# Patient Record
Sex: Female | Born: 1935 | Race: Black or African American | Hispanic: No | Marital: Single | Smoking: Never smoker
Health system: Southern US, Community
[De-identification: ages and names within clinical notes are randomized; demographics above are authoritative.]

## PROBLEM LIST (undated history)

## (undated) DIAGNOSIS — N179 Acute kidney failure, unspecified: Secondary | ICD-10-CM

## (undated) DIAGNOSIS — E785 Hyperlipidemia, unspecified: Secondary | ICD-10-CM

## (undated) DIAGNOSIS — I10 Essential (primary) hypertension: Secondary | ICD-10-CM

## (undated) DIAGNOSIS — F0390 Unspecified dementia without behavioral disturbance: Secondary | ICD-10-CM

---

## 2012-05-16 ENCOUNTER — Emergency Department (HOSPITAL_COMMUNITY)
Admission: EM | Admit: 2012-05-16 | Discharge: 2012-05-22 | Disposition: A | Discharge status: Other Acute Inpatient Hospital | Payer: Medicare Other | Attending: Emergency Medicine | Admitting: Emergency Medicine

## 2012-05-16 ENCOUNTER — Emergency Department (HOSPITAL_COMMUNITY): Payer: Medicare Other

## 2012-05-16 ENCOUNTER — Other Ambulatory Visit: Payer: Self-pay

## 2012-05-16 DIAGNOSIS — R4182 Altered mental status, unspecified: Secondary | ICD-10-CM | POA: Insufficient documentation

## 2012-05-16 DIAGNOSIS — Z79899 Other long term (current) drug therapy: Secondary | ICD-10-CM | POA: Insufficient documentation

## 2012-05-16 DIAGNOSIS — F259 Schizoaffective disorder, unspecified: Secondary | ICD-10-CM | POA: Insufficient documentation

## 2012-05-16 HISTORY — DX: Unspecified dementia, unspecified severity, without behavioral disturbance, psychotic disturbance, mood disturbance, and anxiety: F03.90

## 2012-05-16 HISTORY — DX: Hyperlipidemia, unspecified: E78.5

## 2012-05-16 HISTORY — DX: Essential (primary) hypertension: I10

## 2012-05-16 LAB — CBC
MCHC: 33 g/dL (ref 30.0–36.0)
Platelets: 294 10*3/uL (ref 150–400)
RDW: 12.7 % (ref 11.5–15.5)

## 2012-05-16 LAB — RAPID URINE DRUG SCREEN, HOSP PERFORMED
Amphetamines: NOT DETECTED
Opiates: NOT DETECTED
Tetrahydrocannabinol: NOT DETECTED

## 2012-05-16 LAB — COMPREHENSIVE METABOLIC PANEL
AST: 22 U/L (ref 0–37)
Albumin: 3.9 g/dL (ref 3.5–5.2)
Alkaline Phosphatase: 91 U/L (ref 39–117)
Chloride: 98 mEq/L (ref 96–112)
Creatinine, Ser: 1.18 mg/dL — ABNORMAL HIGH (ref 0.50–1.10)
Potassium: 3.3 mEq/L — ABNORMAL LOW (ref 3.5–5.1)
Sodium: 137 mEq/L (ref 135–145)
Total Bilirubin: 0.8 mg/dL (ref 0.3–1.2)

## 2012-05-16 LAB — DIFFERENTIAL
Basophils Absolute: 0 10*3/uL (ref 0.0–0.1)
Basophils Relative: 0 % (ref 0–1)
Neutro Abs: 5.3 10*3/uL (ref 1.7–7.7)
Neutrophils Relative %: 72 % (ref 43–77)

## 2012-05-16 MED ORDER — LORAZEPAM 1 MG PO TABS
1.0000 mg | ORAL_TABLET | Freq: Three times a day (TID) | ORAL | Status: DC | PRN
  Administered 2012-05-17 – 2012-05-21 (×5): 1 mg via ORAL
  Filled 2012-05-16 (×6): qty 1

## 2012-05-16 MED ORDER — AMLODIPINE BESYLATE 10 MG PO TABS
10.0000 mg | ORAL_TABLET | Freq: Every day | ORAL | Status: DC
  Administered 2012-05-16 – 2012-05-21 (×6): 10 mg via ORAL
  Filled 2012-05-16 (×6): qty 1

## 2012-05-16 MED ORDER — CITALOPRAM HYDROBROMIDE 10 MG PO TABS
10.0000 mg | ORAL_TABLET | Freq: Every day | ORAL | Status: DC
  Administered 2012-05-16 – 2012-05-21 (×6): 10 mg via ORAL
  Filled 2012-05-16 (×6): qty 1

## 2012-05-16 MED ORDER — SIMVASTATIN 20 MG PO TABS
20.0000 mg | ORAL_TABLET | Freq: Every evening | ORAL | Status: DC
  Administered 2012-05-16 – 2012-05-21 (×5): 20 mg via ORAL
  Filled 2012-05-16 (×7): qty 1

## 2012-05-16 MED ORDER — ACETAMINOPHEN 325 MG PO TABS: 650.0000 mg | ORAL_TABLET | Freq: Four times a day (QID) | ORAL | Status: DC | PRN

## 2012-05-16 NOTE — BH Assessment (Signed)
Assessment Note   Katrina Burton is an 76 y.o. female. EMS brings pt from [facility] ED notes indicate that  pt is combative at times towards staff, and having visual hallucinations seeing snakes and stating "my daughter is trying to kill Obama". During time of the assessment patient has flight of ideas focusing on the fact that her daughter and someone is trying to kill Obama. She then rambles about hound dogs out to get various people including this assessor. She then speaks of seeing "big huge snakes". She has also seen demons. Daughter at bedside explains that patient was moved from [place], [place] which is where she was residing alone up to  2 months ago. Patient moved to [facility] to live with daughter and son-in-law. Patient was diagnosed with dementia, per daughter. She was moved into a ALF Social research officer, government) after 2 months of living with daughter b/c she ran away several times. Patient moved into the ALF this past Friday. Daughter sts that over the weekend she began to speak of demons. Since Tuesdays patient has become increasingly delusional speaking of hound dogs, demons, snakes, writing a book, and telling random people that she would put them in her Living Will or give them her disability money. Patient is only oriented to person. Her concentration has decreased. Patient is not sleeping at night and will only take what daughter says as "cat naps" throughout the day. Patient denies SI and HI. No alcohol or drug use noted. Patient has no mental health history and symptoms began over the weekend.   Axis I: Psychotic Disorder NOS Axis II: Deferred Axis III: No past medical history on file. Axis IV: economic problems, educational problems, housing problems, occupational problems, other psychosocial or environmental problems, problems related to social environment, problems with access to health care services and problems with primary support group Axis V: 31-40 impairment in  reality testing  Past Medical History: No past medical history on file.  No past surgical history on file.  Family History: No family history on file.  Social History:  does not have a smoking history on file. She does not have any smokeless tobacco history on file. Her alcohol and drug histories not on file.  Additional Social History:     CIWA: CIWA-Ar BP: 127/84 mmHg Pulse Rate: 107  COWS:    Allergies: No Known Allergies  Home Medications:  (Not in a hospital admission)  OB/GYN Status:  No LMP recorded. Patient is postmenopausal.  General Assessment Data [facility] of [facility]: [facility] ACT Assessment: Yes Living Arrangements: Other (Comment) (moved from [place] to live w/ daught 2 mo's;now in ALF) Can pt return to current living arrangement?: Yes [facility] Living verified she may return once stable) Admission Status: Voluntary Is patient capable of signing voluntary admission?: No Transfer from: Acute Hospital Referral Source: Self/Family/Friend (brought by daughter (not POA); referred by ALF as well)  Education Status Is patient currently in school?: No  Risk to self Suicidal Ideation: No Suicidal Intent: No Is patient at risk for suicide?: No Suicidal Plan?: No Access to Means: No What has been your use of drugs/alcohol within the last 12 months?:  (no alcohol and/or drug use) Previous Attempts/Gestures: No How many times?:  (0) Other Self Harm Risks:  (0) Triggers for Past Attempts:  (no previous attempts or gestures) Intentional Self Injurious Behavior: None Family Suicide History: No Recent stressful life event(s):  (changes w/ living arrangements(lived w/ daughter now in ALF)) Persecutory voices/beliefs?: No Depression: No Depression Symptoms:  (  no symptoms reproted) Substance abuse history and/or treatment for substance abuse?: No Suicide prevention information given to non-admitted patients: Not applicable  Risk to Others Homicidal Ideation:  No Thoughts of Harm to Others: No Current Homicidal Intent: No Current Homicidal Plan: No Access to Homicidal Means: No Identified Victim:  (none reported) History of harm to others?: No Assessment of Violence: On admission Violent Behavior Description:  (aggressive w/ daughter, other pt's @ ALF, today-[employer]) Does patient have access to weapons?: No Criminal Charges Pending?: No Does patient have a court date: No  Psychosis Hallucinations: Visual (unable to verify aud; sts she see's snakes) Delusions: Unspecified;Grandiose (Obama will be shot, wrote a book, snakes, demons, blood houn)  Mental Status Report Appear/Hygiene: Disheveled Eye Contact: Fair Motor Activity: Agitation Speech: Aggressive;Incoherent (Aggressive per notes) Level of Consciousness: Alert Mood: Anxious;Angry;Preoccupied Affect: Anxious;Inconsistent with thought content Anxiety Level: None Thought Processes: Circumstantial;Flight of Ideas Judgement: Impaired Orientation: Person Obsessive Compulsive Thoughts/Behaviors: None  Cognitive Functioning Concentration: Normal Memory: Recent Intact;Remote Intact IQ: Average  ADLScreening [facility] Assessment Services) Patient's cognitive ability adequate to safely complete daily activities?: Yes Patient able to express need for assistance with ADLs?: Yes Independently performs ADLs?: Yes  Abuse/Neglect [facility]) Physical Abuse: Denies Verbal Abuse: Yes, past (Comment) (sts father was verbally mean to her) Sexual Abuse: Denies  Prior Inpatient Therapy Prior Inpatient Therapy: No  Prior Outpatient Therapy Prior Outpatient Therapy: No  ADL Screening (condition at time of admission) Patient's cognitive ability adequate to safely complete daily activities?: Yes Patient able to express need for assistance with ADLs?: Yes Independently performs ADLs?: Yes Weakness of Legs: None Weakness of Arms/Hands: None  Home Assistive Devices/Equipment Home Assistive  Devices/Equipment: None    Abuse/Neglect Assessment (Assessment to be complete while patient is alone) Physical Abuse: Denies Verbal Abuse: Yes, past (Comment) (sts father was verbally mean to her) Sexual Abuse: Denies Exploitation of patient/patient's resources: Denies Self-Neglect: Denies Values / Beliefs Cultural Requests During Hospitalization: None Spiritual Requests During Hospitalization: None   Advance Directives (For Healthcare) Advance Directive: Patient does not have advance directive Nutrition Screen Diet: Regular Unintentional weight loss greater than 10lbs within the last month: No Problems chewing or swallowing foods and/or liquids: No Home Tube Feeding or Total Parenteral Nutrition (TPN): No Patient appears severely malnourished: No Pregnant or Lactating: No  Additional Information 1:1 In Past 12 Months?: No CIRT Risk: No Elopement Risk: Yes (pt ran away from daughters home several x's) Does patient have medical clearance?: Yes  Child/Adolescent Assessment Running Away Risk: Admits  Disposition:  Disposition Disposition of Patient:  (Disposition pendign placement at a Geri Psych facility)  On Site Evaluation by:   Reviewed with Physician:     Octaviano Batty 05/16/2012 2:35 PM

## 2012-05-16 NOTE — ED Notes (Signed)
EMS brings pt from [facility], pt is combative at time towards staff, and having visual hallucinations seeing snakes and stating "my daughter is trying to kill Obama".

## 2012-05-16 NOTE — ED Provider Notes (Signed)
History     CSN: [id number]  Arrival date & time 05/16/12  4098   First MD Initiated Contact with Patient 05/16/12 0827      Chief Complaint  Patient presents with  . Medical Clearance  . Altered Mental Status    (Consider location/radiation/quality/duration/timing/severity/associated sxs/prior treatment) Patient is a 76 y.o. female presenting with altered mental status. The history is provided by the patient (pt states her daughter is a vampire and wants to kill obama). No language interpreter was used.  Altered Mental Status This is a new problem. The current episode started 12 to 24 hours ago. The problem occurs constantly. The problem has not changed since onset.Pertinent negatives include no chest pain, no abdominal pain and no headaches. The symptoms are aggravated by nothing. The symptoms are relieved by nothing. She has tried nothing for the symptoms. The treatment provided no relief.    No past medical history on file.  No past surgical history on file.  No family history on file.  History  Substance Use Topics  . Smoking status: Not on file  . Smokeless tobacco: Not on file  . Alcohol Use: Not on file    OB History    No data available      Review of Systems  Unable to perform ROS: Psychiatric disorder  Constitutional: Negative for fatigue.  HENT: Negative for congestion, sinus pressure and ear discharge.   Eyes: Negative for discharge.  Respiratory: Negative for cough.   Cardiovascular: Negative for chest pain.  Gastrointestinal: Negative for abdominal pain and diarrhea.  Genitourinary: Negative for frequency and hematuria.  Musculoskeletal: Negative for back pain.  Skin: Negative for rash.  Neurological: Negative for seizures and headaches.  Hematological: Negative.   Psychiatric/Behavioral: Positive for hallucinations and altered mental status.    Allergies  Review of patient's allergies indicates no known allergies.  Home Medications   Current  Outpatient Rx  Name Route Sig Dispense Refill  . ACETAMINOPHEN 325 MG PO TABS Oral Take 650 mg by mouth every 6 (six) hours as needed. pain    . AMLODIPINE BESYLATE 10 MG PO TABS Oral Take 10 mg by mouth daily.    Marland Kitchen CITALOPRAM HYDROBROMIDE 10 MG PO TABS Oral Take 10 mg by mouth daily.    Marland Kitchen SIMVASTATIN 20 MG PO TABS Oral Take 20 mg by mouth every evening.      BP 144/88  Pulse 107  Temp(Src) 98.1 F (36.7 C) (Oral)  Resp 18  SpO2 99%  Physical Exam  Constitutional: She appears well-developed.  HENT:  Head: Normocephalic and atraumatic.  Eyes: Conjunctivae and EOM are normal. No scleral icterus.  Neck: Neck supple. No thyromegaly present.  Cardiovascular: Normal rate and regular rhythm.  Exam reveals no gallop and no friction rub.   No murmur heard. Pulmonary/Chest: No stridor. She has no wheezes. She has no rales. She exhibits no tenderness.  Abdominal: She exhibits no distension. There is no tenderness. There is no rebound.  Musculoskeletal: Normal range of motion. She exhibits no edema.  Lymphadenopathy:    She has no cervical adenopathy.  Neurological: She is alert. Coordination normal.       Pt is oriented to person only  Skin: No rash noted. No erythema.  Psychiatric:       Pt having hallucinations    ED Course  Procedures (including critical care time)  Labs Reviewed  COMPREHENSIVE METABOLIC PANEL - Abnormal; Notable for the following:    Potassium 3.3 (*)    Glucose,  Bld 130 (*)    Creatinine, Ser 1.18 (*)    GFR calc non Af Amer 45 (*)    GFR calc Af Amer 52 (*)    All other components within normal limits  CBC  DIFFERENTIAL  ETHANOL  URINE RAPID DRUG SCREEN (HOSP PERFORMED)   No results found.   No diagnosis found.    MDM          Benny Lennert, MD 05/16/12 1259  Benny Lennert, MD 05/18/12 [phone]

## 2012-05-16 NOTE — Discharge Planning (Signed)
CSW called [facility] [phone]) and spoke with Letitia Caul who advised after patient has been psychiatrically stabilized, she is able to return to their facility.   Manson Passey Tenna Lacko ANN S , MSW, LCSWA 05/16/2012 2:36 PM [phone]

## 2012-05-16 NOTE — ED Notes (Signed)
ZOX:[id number]<AV> Expected date:05/16/12<BR> Expected time: 8:09 AM<BR> Means of arrival:Ambulance<BR> Comments:<BR> combative

## 2012-05-17 ENCOUNTER — Encounter (HOSPITAL_COMMUNITY): Payer: Self-pay | Admitting: Emergency Medicine

## 2012-05-17 NOTE — ED Notes (Signed)
Pt continues to pend disposition at [facility] and [facility].

## 2012-05-17 NOTE — ED Provider Notes (Signed)
Pt seen and examined.  VSS.  Nursing notes reviewed. Heart. RRR   Lungs CTA. Will continue with current plan per the ACT team.  Celene Kras, MD 05/17/12 [phone]

## 2012-05-17 NOTE — ED Notes (Signed)
Faxed EKG, XR & labs to [facility].

## 2012-05-17 NOTE — BHH Counselor (Addendum)
TC to [facility] to follow up on info sent 5/23. Got voice mail - left message. TC from April @ [facility]. Stated they had not gotten any information on this patient. Faxed over all info again.

## 2012-05-18 LAB — GLUCOSE, CAPILLARY

## 2012-05-18 MED ORDER — RISPERIDONE 0.5 MG PO TABS
0.5000 mg | ORAL_TABLET | Freq: Every day | ORAL | Status: DC
  Administered 2012-05-18 – 2012-05-20 (×3): 0.5 mg via ORAL
  Filled 2012-05-18 (×3): qty 1

## 2012-05-18 NOTE — ED Provider Notes (Addendum)
Pt alert, conversant w staff. Elevated mood. Normal vitals. Pt declines any complaints. Pending psych placement.   Suzi Roots, MD 05/18/12 [phone]  telepsych eval recommends inpt psych tx, risperdal .5 mg qhs. Act placement pending.   Suzi Roots, MD 05/18/12 [phone]

## 2012-05-18 NOTE — ED Notes (Signed)
Pt up to bathroom.

## 2012-05-18 NOTE — BHH Counselor (Signed)
[facility] called requesting additional info which was faxed and info requested filed in pt chart.

## 2012-05-18 NOTE — ED Notes (Signed)
Pt completed telepsych interview.

## 2012-05-18 NOTE — ED Notes (Signed)
Pt up to bathroom again. Pt went to sleep for about 10-15 minutes then she woke up and ate her dinner and now she's laying down watching TV.

## 2012-05-18 NOTE — ED Notes (Signed)
Pt very agitated, refusing to stay in her room, talking loudly; Security called to bedside; pt returned to room; Ativan 1 mg PO given.

## 2012-05-18 NOTE — ED Notes (Signed)
Report given to Tonya, RN.

## 2012-05-19 LAB — POCT I-STAT, CHEM 8
Calcium, Ion: 1.27 mmol/L (ref 1.12–1.32)
Chloride: 105 mEq/L (ref 96–112)
Glucose, Bld: 128 mg/dL — ABNORMAL HIGH (ref 70–99)
HCT: 35 % — ABNORMAL LOW (ref 36.0–46.0)
Hemoglobin: 11.9 g/dL — ABNORMAL LOW (ref 12.0–15.0)
TCO2: 27 mmol/L (ref 0–100)

## 2012-05-19 MED ORDER — ZIPRASIDONE MESYLATE 20 MG IM SOLR
10.0000 mg | Freq: Once | INTRAMUSCULAR | Status: AC
  Administered 2012-05-19: 20 mg via INTRAMUSCULAR
  Filled 2012-05-19: qty 20

## 2012-05-19 MED ORDER — QUETIAPINE FUMARATE 25 MG PO TABS
12.5000 mg | ORAL_TABLET | Freq: Every day | ORAL | Status: DC
  Administered 2012-05-19 – 2012-05-20 (×2): 12.5 mg via ORAL
  Filled 2012-05-19 (×2): qty 1

## 2012-05-19 NOTE — BHH Counselor (Signed)
Pt declined at [facility] by Dr. Henrene Hawking due to dementia.  [facility] only had one bed today and it gave it to another pt at [facility].  April at [facility] will run pt by MD in the AM once they have discharges.  [facility] recommends pt be IVC and for that paper work to be faxed to them.  MD will consider.

## 2012-05-19 NOTE — ED Notes (Signed)
Per Dr Rubin Payor give pt Seroquel 12.5 mg now due to change in pt's behavior, she now thinks writer is trying to kill her and she's not going to allow that to happen, she's going to use witchcraft and put a root on Clinical research associate, she told writer to get out of her room now, go on just get out. Earlier today, pt thought people were trying to kill her and the Clinical research associate and she was protecting them. Her demeanor has drastically changed from yesterday and she seems angry, hostile, definitely responding to internal and external stimuli.

## 2012-05-19 NOTE — ED Notes (Signed)
Spoke with pt's family members about not visiting with pt at this time as she was very agitated. They were agreeable. Verbalized understanding of pt's need for time to "decompress" and allow medication to work. Family members would like to speak with Dr. Wynona Canes about requesting that the facility the pt lives in moving her to a different room as her roommate agitates her. They will call in the am to attempt to contact him.

## 2012-05-19 NOTE — ED Notes (Signed)
Pt is up in the shower.

## 2012-05-19 NOTE — BH Assessment (Signed)
Assessment Note   From original 5/23 assessment: Katrina Burton is an 76 y.o. female. EMS brings pt from [facility] ED notes indicate that  pt is combative at times towards staff, and having visual hallucinations seeing snakes and stating "my daughter is trying to kill Obama". During time of the assessment patient has flight of ideas focusing on the fact that her daughter and someone is trying to kill Obama. She then rambles about hound dogs out to get various people including this assessor. She then speaks of seeing "big huge snakes". She has also seen demons. Patient was diagnosed with dementia, per daughter. She was moved into a ALF Social research officer, government) after 2 months of living with daughter b/c she ran away several times. Patient moved into the ALF this past Friday. Since Tuesdays patient has become increasingly delusional speaking of hound dogs, demons, snakes, writing a book, and telling random people that she would put them in her Living Will or give them her disability money. Patient is only oriented to person. Patient has no mental health history and symptoms began over the weekend.    Writer reassessment 05/19/12 - Pt is delusional and having hallucinations. Pt denies A/VH. However, pt tells Clinical research associate that her friend Katrina Burton works at [employer] and has visited her several times. (No one with that name works here). Pt sitting on bed, good eye contact. Pt is pleasant and cooperative. Pt states that she lives with a woman named Katrina Burton who gives pt new shoes. Pt denies SI and HI. Prior to reassessment, Clinical research associate could hear pt screaming for several minutes from her room. At that time pt was very agitated and aggressive.  Per RN Tonya's note from 1900 today: Per Dr Rubin Payor give pt Seroquel 12.5 mg now due to change in pt's behavior, she now thinks writer is trying to kill her and she's not going to allow that to happen, she's going to use witchcraft and put a root on Clinical research associate, she told writer to  get out of her room now, go on just get out. Earlier today, pt thought people were trying to kill her and the Clinical research associate and she was protecting them. Her demeanor has drastically changed from yesterday and she seems angry, hostile, definitely responding to internal and external stimuli.     Axis I: Psychotic Disorder NOS Axis II: Deferred Axis III:  Past Medical History  Diagnosis Date  . Dementia   . Hypertension   . Hyperlipidemia   . Diabetes mellitus    Axis IV: housing problems, other psychosocial or environmental problems, problems related to social environment and problems with primary support group Axis V: 21-30 behavior considerably influenced by delusions or hallucinations OR serious impairment in judgment, communication OR inability to function in almost all areas  Past Medical History:  Past Medical History  Diagnosis Date  . Dementia   . Hypertension   . Hyperlipidemia   . Diabetes mellitus     History reviewed. No pertinent past surgical history.  Family History: History reviewed. No pertinent family history.  Social History:  reports that she has never smoked. She does not have any smokeless tobacco history on file. She reports that she does not drink alcohol or use illicit drugs.  Additional Social History:     CIWA: CIWA-Ar BP: 163/82 mmHg Pulse Rate: 95  COWS:    Allergies: No Known Allergies  Home Medications:  (Not in a hospital admission)  OB/GYN Status:  No LMP recorded. Patient is postmenopausal.  General Assessment  Data [facility] of [facility]: [facility] ACT Assessment: Yes Living Arrangements: Other (Comment) (moved from [place] to live w/ daught 2 mo's;now in ALF) Can pt return to current living arrangement?: Yes [facility] Living verified she may return once stable) Admission Status: Voluntary Is patient capable of signing voluntary admission?: No Transfer from: Acute Hospital Referral Source: Self/Family/Friend (brought by daughter (not POA);  referred by ALF as well)  Education Status Is patient currently in school?: No  Risk to self Suicidal Ideation: No Suicidal Intent: No Is patient at risk for suicide?: No Suicidal Plan?: No Access to Means: No What has been your use of drugs/alcohol within the last 12 months?:  (no alcohol and/or drug use) Previous Attempts/Gestures: No How many times?:  (0) Other Self Harm Risks:  (0) Triggers for Past Attempts:  (no previous attempts or gestures) Intentional Self Injurious Behavior: None Family Suicide History: No Recent stressful life event(s):  (changes w/ living arrangements(lived w/ daughter now in ALF)) Persecutory voices/beliefs?: No Depression: No Depression Symptoms:  (no symptoms reproted) Substance abuse history and/or treatment for substance abuse?: No Suicide prevention information given to non-admitted patients: Not applicable  Risk to Others Homicidal Ideation: No Thoughts of Harm to Others: No Current Homicidal Intent: No Current Homicidal Plan: No Access to Homicidal Means: No Identified Victim:  (none reported) History of harm to others?: No Assessment of Violence: On admission Violent Behavior Description:  (aggressive w/ daughter, other pt's @ ALF, today-[employer]) Does patient have access to weapons?: No Criminal Charges Pending?: No Does patient have a court date: No  Psychosis Hallucinations: Visual;Auditory Delusions:  (lives with woman named Information systems manager)  Mental Status Report Appear/Hygiene: Disheveled Eye Contact: Good Motor Activity: Freedom of movement Speech: Incoherent Level of Consciousness: Alert Mood: Anxious;Preoccupied Affect: Anxious;Inconsistent with thought content Anxiety Level: None Thought Processes: Flight of Ideas;Tangential;Circumstantial Judgement: Impaired Orientation: Person Obsessive Compulsive Thoughts/Behaviors: None  Cognitive Functioning Concentration: Normal Memory: Recent Intact;Remote Intact IQ:  Average  ADLScreening [facility] Assessment Services) Patient's cognitive ability adequate to safely complete daily activities?: Yes Patient able to express need for assistance with ADLs?: Yes Independently performs ADLs?: Yes  Abuse/Neglect [facility]) Physical Abuse: Denies Verbal Abuse: Yes, past (Comment) (sts father was verbally mean to her) Sexual Abuse: Denies  Prior Inpatient Therapy Prior Inpatient Therapy: No  Prior Outpatient Therapy Prior Outpatient Therapy: No  ADL Screening (condition at time of admission) Patient's cognitive ability adequate to safely complete daily activities?: Yes Patient able to express need for assistance with ADLs?: Yes Independently performs ADLs?: Yes Communication: Independent Is this a change from baseline?: Pre-admission baseline Dressing (OT): Needs assistance Is this a change from baseline?: Pre-admission baseline Grooming: Needs assistance Is this a change from baseline?: Pre-admission baseline Feeding: Independent Bathing: Needs assistance Is this a change from baseline?: Pre-admission baseline Toileting: Needs assistance Is this a change from baseline?: Pre-admission baseline In/Out Bed: Needs assistance Is this a change from baseline?: Pre-admission baseline Walks in Home: Needs assistance Is this a change from baseline?: Pre-admission baseline Weakness of Legs: None Weakness of Arms/Hands: None  Home Assistive Devices/Equipment Home Assistive Devices/Equipment: None    Abuse/Neglect Assessment (Assessment to be complete while patient is alone) Physical Abuse: Denies Verbal Abuse: Yes, past (Comment) (sts father was verbally mean to her) Sexual Abuse: Denies Exploitation of patient/patient's resources: Denies Self-Neglect: Denies Values / Beliefs Cultural Requests During Hospitalization: None Spiritual Requests During Hospitalization: None   Advance Directives (For Healthcare) Advance Directive: Patient does not have advance  directive Nutrition Screen Diet: Regular  Unintentional weight loss greater than 10lbs within the last month: No Problems chewing or swallowing foods and/or liquids: No Home Tube Feeding or Total Parenteral Nutrition (TPN): No Patient appears severely malnourished: No Pregnant or Lactating: No  Additional Information 1:1 In Past 12 Months?: No CIRT Risk: No Elopement Risk: Yes (pt ran away from daughters home several x's) Does patient have medical clearance?: Yes  Child/Adolescent Assessment Running Away Risk: Admits  Disposition:  Disposition Disposition of Patient:  (Disposition pendign placement at a Geri Psych facility)  On Site Evaluation by:   Reviewed with Physician:     Donnamarie Rossetti P 05/19/2012 10:44 PM

## 2012-05-19 NOTE — ED Provider Notes (Signed)
  Physical Exam  BP 163/82  Pulse 95  Temp(Src) 97.6 F (36.4 C) (Oral)  Resp 20  SpO2 100%  Physical Exam  ED Course  Procedures  MDM Patient became disruptive and began yelling. She'll be given some more sedation.      Juliet Rude. Rubin Payor, MD 05/19/12 1941

## 2012-05-19 NOTE — ED Notes (Signed)
Pt's labs were drawn.

## 2012-05-19 NOTE — ED Notes (Signed)
Pt has been yelling and fussing talking to herself about people that are trying to kill [place] but her and "Bud" are protecting [place]. Pt denies AVH but clearly she's suffering from both more than likely. Pt's demeanor is very different than it was yesterday. She seems angry but denies being angry.

## 2012-05-19 NOTE — ED Provider Notes (Signed)
77 y.o. Female from [facility] with reports visual hallucinations and combative.  [place] to review today at 11 a.m.  Temp:  [94 F (34.4 C)-98.1 F (36.7 C)] 97.6 F (36.4 C) (05/26 0612) Pulse Rate:  [70-84] 84  (05/26 0612) Resp:  [20] 20  (05/26 0612) BP: (115-156)/(61-82) 152/82 mmHg (05/26 0612) SpO2:  [99 %-100 %] 99 % (05/26 0612)   Hilario Quarry, MD 05/19/12 [phone]

## 2012-05-19 NOTE — BHH Counselor (Signed)
Albesa Seen, assessment counselor at [facility], submitted Pt for admission to [facility]. Consulted with Theodoro Kos, [facility] who confirmed bed availability. Dr. Elsie Saas reviewed Pt's clinical information and declined due to Pt's medical acuity and diagnosis of dementia. He recommended referral to a gero-psych unit. Communicated information to [employer].  Shela Commons, [facility]

## 2012-05-20 LAB — CBC
HCT: 37.9 % (ref 36.0–46.0)
MCHC: 33.5 g/dL (ref 30.0–36.0)
RDW: 12.8 % (ref 11.5–15.5)

## 2012-05-20 NOTE — ED Notes (Signed)
CSW faxed pt's IVC 1st opinion, CBC and labs to [facility] for them to review. CSW will continue to follow up regarding disposition.

## 2012-05-20 NOTE — ED Notes (Signed)
Pt is in her room talking aloud, confused, but not combative. Sounds as if she is discussing events from the past. Crayons and coloring sheets given. Pt is watching television.

## 2012-05-20 NOTE — ED Notes (Signed)
Pt is confused, restless at times. Talking to herself in her room. Coloring multiple pictures for staff. Calm and cooperative but very disoriented.

## 2012-05-20 NOTE — BHH Counselor (Signed)
IVC paperwork faxed to [place]. 

## 2012-05-20 NOTE — ED Notes (Signed)
Per staff at [facility], pt has been accepted however her bed will not be ready until 05/21/12. Oncoming staff will need to call back the morning of 05/21/12 to verify accepting MD and bed availability.

## 2012-05-20 NOTE — ED Notes (Signed)
Pt from Psych ED to room 28. Pt oriented only to self but pleasant and cooperative. Sitting up in chair, coloring in coloring book w/ crayons. No c/o at present. Report from [place], [place].

## 2012-05-20 NOTE — ED Provider Notes (Signed)
The patient is alert, and currently, calm. She remains confused and delusional. Patient cannot be discharged to home and a placement is being sought. She reportedly has been accepted at [facility], but there are no beds yet.  Flint Melter, MD 05/20/12 [phone]

## 2012-05-20 NOTE — BHH Counselor (Signed)
Magistrate Katrina Burton received IVC paperwork and 1st opinion. Originals filed in IVC log and copies placed in pt's chart.

## 2012-05-20 NOTE — ED Notes (Addendum)
Patient used her room as a BR voiding in the floor and having a BM on the floor, heard people talking and laughing  At her and she decided she could not go to the BR, taking her clothes off, taking a shower and changing her scrubs, will be transferring to Rm 28 in TCU.

## 2012-05-20 NOTE — ED Notes (Signed)
Pt spoke w/ her daughter, Talbert Forest, on the phone. Tells her daughter that she is "not crazy" and "not a lunatic". States that the bloodhounds are after her and that everyone needs to beware. Presently sitting in her room coloring and reading magazines quietly.

## 2012-05-21 NOTE — ED Notes (Signed)
Patient is resting comfortably. 

## 2012-05-21 NOTE — ED Notes (Signed)
Pt is moderately anxious.  Pt is having loose thoughts, but is cooperative at this time.

## 2012-05-21 NOTE — ED Notes (Signed)
Pt is currently coloring and behavior is calm.

## 2012-05-21 NOTE — Discharge Planning (Signed)
CSW left message at [facility] center regarding patient's bed availability.  Katrina Burton ANN S , MSW, LCSWA 05/21/2012 7:47 AM [phone]

## 2012-05-21 NOTE — ED Notes (Signed)
Pt has been accepted to [facility] for inpatient psychiatric treatment. EDP has been notified and is in agreement with disposition. RN made aware to call report. Pt is under IVC and will require sheriff transport. No further needs identified at this time.

## 2012-05-21 NOTE — BHH Counselor (Signed)
Pt accepted at [facility].  Will need to be transported after 5 pm this evening.

## 2012-05-21 NOTE — ED Provider Notes (Signed)
BP 147/67  Pulse 84  Temp(Src) 98.3 F (36.8 C) (Oral)  Resp 16  SpO2 99% Pt resting comfortably. No issue overnight. Accepted at [facility]. Continues to await bed.   Loren Racer, MD 05/21/12 [phone]

## 2012-05-21 NOTE — ED Notes (Signed)
Attempted to call report to nurse at [facility].  Was told by nurse to call report when patient is leaving this facility.  Will give report/ message to oncoming nurse.

## 2013-01-18 ENCOUNTER — Encounter (HOSPITAL_COMMUNITY): Payer: Self-pay | Admitting: *Deleted

## 2013-01-18 ENCOUNTER — Emergency Department (HOSPITAL_COMMUNITY): Payer: Medicare Other

## 2013-01-18 ENCOUNTER — Emergency Department (HOSPITAL_COMMUNITY)
Admission: EM | Admit: 2013-01-18 | Discharge: 2013-01-19 | Disposition: A | Discharge status: Home / Self Care | Payer: Medicare Other | Attending: Emergency Medicine | Admitting: Emergency Medicine

## 2013-01-18 DIAGNOSIS — R296 Repeated falls: Secondary | ICD-10-CM | POA: Insufficient documentation

## 2013-01-18 DIAGNOSIS — F0390 Unspecified dementia without behavioral disturbance: Secondary | ICD-10-CM | POA: Insufficient documentation

## 2013-01-18 DIAGNOSIS — S0990XA Unspecified injury of head, initial encounter: Secondary | ICD-10-CM | POA: Insufficient documentation

## 2013-01-18 DIAGNOSIS — IMO0002 Reserved for concepts with insufficient information to code with codable children: Secondary | ICD-10-CM | POA: Insufficient documentation

## 2013-01-18 DIAGNOSIS — Y9389 Activity, other specified: Secondary | ICD-10-CM | POA: Insufficient documentation

## 2013-01-18 DIAGNOSIS — Y9210 Unspecified residential institution as the place of occurrence of the external cause: Secondary | ICD-10-CM | POA: Insufficient documentation

## 2013-01-18 DIAGNOSIS — I10 Essential (primary) hypertension: Secondary | ICD-10-CM | POA: Insufficient documentation

## 2013-01-18 DIAGNOSIS — W19XXXA Unspecified fall, initial encounter: Secondary | ICD-10-CM

## 2013-01-18 DIAGNOSIS — S79919A Unspecified injury of unspecified hip, initial encounter: Secondary | ICD-10-CM | POA: Insufficient documentation

## 2013-01-18 DIAGNOSIS — E119 Type 2 diabetes mellitus without complications: Secondary | ICD-10-CM | POA: Insufficient documentation

## 2013-01-18 DIAGNOSIS — E785 Hyperlipidemia, unspecified: Secondary | ICD-10-CM | POA: Insufficient documentation

## 2013-01-18 DIAGNOSIS — S8990XA Unspecified injury of unspecified lower leg, initial encounter: Secondary | ICD-10-CM | POA: Insufficient documentation

## 2013-01-18 DIAGNOSIS — Z79899 Other long term (current) drug therapy: Secondary | ICD-10-CM | POA: Insufficient documentation

## 2013-01-18 DIAGNOSIS — S79929A Unspecified injury of unspecified thigh, initial encounter: Secondary | ICD-10-CM | POA: Insufficient documentation

## 2013-01-18 LAB — CBC WITH DIFFERENTIAL/PLATELET
Basophils Absolute: 0 10*3/uL (ref 0.0–0.1)
Eosinophils Absolute: 0.1 10*3/uL (ref 0.0–0.7)
Eosinophils Relative: 2 % (ref 0–5)
Lymphocytes Relative: 29 % (ref 12–46)
MCV: 90.4 fL (ref 78.0–100.0)
Platelets: 95 10*3/uL — ABNORMAL LOW (ref 150–400)
RDW: 12.7 % (ref 11.5–15.5)
WBC: 6.6 10*3/uL (ref 4.0–10.5)

## 2013-01-18 LAB — URINALYSIS, ROUTINE W REFLEX MICROSCOPIC
Bilirubin Urine: NEGATIVE
Ketones, ur: NEGATIVE mg/dL
Nitrite: NEGATIVE
Specific Gravity, Urine: 1.006 (ref 1.005–1.030)
Urobilinogen, UA: 0.2 mg/dL (ref 0.0–1.0)

## 2013-01-18 LAB — BASIC METABOLIC PANEL
CO2: 25 mEq/L (ref 19–32)
Calcium: 10.3 mg/dL (ref 8.4–10.5)
GFR calc Af Amer: 58 mL/min — ABNORMAL LOW (ref >90)
GFR calc non Af Amer: 50 mL/min — ABNORMAL LOW (ref >90)
Sodium: 138 mEq/L (ref 135–145)

## 2013-01-18 NOTE — ED Provider Notes (Signed)
History     CSN: [id number]  Arrival date & time 01/18/13  2151   First MD Initiated Contact with Patient 01/18/13 2206      Chief Complaint  Patient presents with  . Fall    (Consider location/radiation/quality/duration/timing/severity/associated sxs/prior treatment) HPI Comments: This is a 77 year old female, past medical history her markable for dementia, hypertension, hyperlipidemia, and diabetes, who presents emergency department with chief complaint of fall. Patient is a resident at [facility]. Patient is a very poor historian, but states that she fell while in the bathroom tonight. She denies any loss of consciousness. She states that she hit her head, and that her left leg and left hip are hurting her. She states the pain is 7/10. Nothing makes her symptoms better or worse. She has not tried anything to alleviate her symptoms. Level 5 caveat applies.  The history is provided by the patient. No language interpreter was used.    Past Medical History  Diagnosis Date  . Dementia   . Hypertension   . Hyperlipidemia   . Diabetes mellitus     History reviewed. No pertinent past surgical history.  History reviewed. No pertinent family history.  History  Substance Use Topics  . Smoking status: Never Smoker   . Smokeless tobacco: Not on file  . Alcohol Use: No    OB History    Grav Para Term Preterm Abortions TAB SAB Ect Mult Living                  Review of Systems  Unable to perform ROS: Dementia    Allergies  Lasix; Lipitor; and Soap  Home Medications   Current Outpatient Rx  Name  Route  Sig  Dispense  Refill  . ACETAMINOPHEN 325 MG PO TABS   Oral   Take 650 mg by mouth every 4 (four) hours as needed. For headache or fever         . ALBUTEROL SULFATE (2.5 MG/3ML) 0.083% IN NEBU   Nebulization   Take 2.5 mg by nebulization every 4 (four) hours as needed. For wheezing         . AMLODIPINE BESYLATE 10 MG PO TABS   Oral   Take 10 mg  by mouth daily.         Marland Kitchen CITALOPRAM HYDROBROMIDE 10 MG PO TABS   Oral   Take 10 mg by mouth daily.         Marland Kitchen DIVALPROEX SODIUM ER 250 MG PO TB24   Oral   Take 250 mg by mouth at bedtime.         . GUAIFENESIN-CODEINE 100-10 MG/5ML PO SYRP   Oral   Take 10 mLs by mouth every 6 (six) hours as needed. For cough         . HYDROCHLOROTHIAZIDE 25 MG PO TABS   Oral   Take 25 mg by mouth daily.         Marland Kitchen LOPERAMIDE HCL 2 MG PO CAPS   Oral   Take 2 mg by mouth daily as needed. For diarrhea         . LORAZEPAM 0.5 MG PO TABS   Oral   Take 0.5 mg by mouth 2 (two) times daily.         Marland Kitchen OLANZAPINE 15 MG PO TABS   Oral   Take 15 mg by mouth at bedtime.         Marland Kitchen POTASSIUM CHLORIDE ER 10 MEQ PO TBCR  Oral   Take 10 mEq by mouth daily.         Marland Kitchen RIVASTIGMINE 9.5 MG/24HR TD PT24   Transdermal   Place 1 patch onto the skin daily.         Marland Kitchen SIMVASTATIN 20 MG PO TABS   Oral   Take 20 mg by mouth every evening.         Marland Kitchen SPIRONOLACTONE 25 MG PO TABS   Oral   Take 25 mg by mouth daily.         . TRAMADOL HCL 50 MG PO TABS   Oral   Take 50 mg by mouth 2 (two) times daily.         . TRAZODONE HCL 150 MG PO TABS   Oral   Take 150 mg by mouth at bedtime.           BP 166/76  Pulse 109  Temp 99.1 F (37.3 C) (Oral)  Resp 14  SpO2 93%  Physical Exam  Nursing note and vitals reviewed. Constitutional: She is oriented to person, place, and time. She appears well-developed and well-nourished.  HENT:  Head: Normocephalic and atraumatic.  Eyes: Conjunctivae normal and EOM are normal.  Neck: Normal range of motion. Neck supple.  Cardiovascular: Normal rate and regular rhythm.  Exam reveals no gallop and no friction rub.   No murmur heard.      Mildly tachycardic  Pulmonary/Chest: Effort normal and breath sounds normal. No respiratory distress. She has no wheezes. She has no rales. She exhibits no tenderness.  Abdominal: Soft. Bowel sounds are  normal. She exhibits no distension and no mass. There is no tenderness. There is no rebound and no guarding.  Musculoskeletal: Normal range of motion. She exhibits no edema and no tenderness.       No signs of trauma or gross abnormality, left hip painful to palpation over left greater trochanter.   Neurological: She is alert and oriented to person, place, and time.       CN 3-12 intact  Skin: Skin is warm and dry.  Psychiatric: She has a normal mood and affect. Her behavior is normal. Judgment and thought content normal.    ED Course  Procedures (including critical care time)   Labs Reviewed  CBC WITH DIFFERENTIAL  BASIC METABOLIC PANEL  URINALYSIS, ROUTINE W REFLEX MICROSCOPIC    ED ECG REPORT  I personally interpreted this EKG   Date: 01/18/2013   Rate: 92  Rhythm: normal sinus rhythm  QRS Axis: normal  Intervals: normal  ST/T Wave abnormalities: nonspecific T wave changes  Conduction Disutrbances:none  Narrative Interpretation:   Old EKG Reviewed: unchanged  Results for orders placed during the hospital encounter of 01/18/13  CBC WITH DIFFERENTIAL      Component Value Range   WBC 6.6  4.0 - 10.5 K/uL   RBC 3.95  3.87 - 5.11 MIL/uL   Hemoglobin 12.4  12.0 - 15.0 g/dL   HCT 91.4 (*) 78.2 - 95.6 %   MCV 90.4  78.0 - 100.0 fL   MCH 31.4  26.0 - 34.0 pg   MCHC 34.7  30.0 - 36.0 g/dL   RDW 21.3  08.6 - 57.8 %   Platelets 95 (*) 150 - 400 K/uL   Neutrophils Relative 61  43 - 77 %   Neutro Abs 4.0  1.7 - 7.7 K/uL   Lymphocytes Relative 29  12 - 46 %   Lymphs Abs 1.9  0.7 - 4.0 K/uL  Monocytes Relative 8  3 - 12 %   Monocytes Absolute 0.5  0.1 - 1.0 K/uL   Eosinophils Relative 2  0 - 5 %   Eosinophils Absolute 0.1  0.0 - 0.7 K/uL   Basophils Relative 0  0 - 1 %   Basophils Absolute 0.0  0.0 - 0.1 K/uL  BASIC METABOLIC PANEL      Component Value Range   Sodium 138  135 - 145 mEq/L   Potassium 4.0  3.5 - 5.1 mEq/L   Chloride 99  96 - 112 mEq/L   CO2 25  19 - 32  mEq/L   Glucose, Bld 150 (*) 70 - 99 mg/dL   BUN 22  6 - 23 mg/dL   Creatinine, Ser 1.61  0.50 - 1.10 mg/dL   Calcium 09.6  8.4 - 04.5 mg/dL   GFR calc non Af Amer 50 (*) >90 mL/min   GFR calc Af Amer 58 (*) >90 mL/min  URINALYSIS, ROUTINE W REFLEX MICROSCOPIC      Component Value Range   Color, Urine YELLOW  YELLOW   APPearance CLEAR  CLEAR   Specific Gravity, Urine 1.006  1.005 - 1.030   pH 7.0  5.0 - 8.0   Glucose, UA NEGATIVE  NEGATIVE mg/dL   Hgb urine dipstick NEGATIVE  NEGATIVE   Bilirubin Urine NEGATIVE  NEGATIVE   Ketones, ur NEGATIVE  NEGATIVE mg/dL   Protein, ur NEGATIVE  NEGATIVE mg/dL   Urobilinogen, UA 0.2  0.0 - 1.0 mg/dL   Nitrite NEGATIVE  NEGATIVE   Leukocytes, UA TRACE (*) NEGATIVE  URINE MICROSCOPIC-ADD ON      Component Value Range   Squamous Epithelial / LPF RARE  RARE   WBC, UA 0-2  <3 WBC/hpf   RBC / HPF 0-2  <3 RBC/hpf   Bacteria, UA RARE  RARE   Dg Chest 2 View  01/19/2013  *RADIOLOGY REPORT*  Clinical Data: Chest pain.  Hypertensive diabetic.  CHEST - 2 VIEW  Comparison: 05/16/2012.  Findings: Cardiomegaly.  Pulmonary vascular prominence most notable centrally.  Prominent mediastinum with  tortuous aorta. Limiting for evaluating for underlying mediastinal mass or adenopathy.  No segmental consolidation or pneumothorax.  Tiny pleural effusions not excluded.  IMPRESSION: Cardiomegaly.  Pulmonary vascular prominence most notable centrally.  Prominent mediastinum with  tortuous aorta. Limiting for evaluating for underlying mediastinal mass or adenopathy.  No segmental consolidation or pneumothorax.  Tiny pleural effusions not excluded.   Original Report Authenticated By: Lacy Duverney, M.D.    Dg Hip Complete Left  01/19/2013  *RADIOLOGY REPORT*  Clinical Data: Status post fall; left hip pain.  LEFT HIP - COMPLETE 2+ VIEW  Comparison: None.  Findings: There is no evidence of fracture or dislocation.  Both femoral heads are seated normally within their respective  acetabula.  The proximal left femur appears intact.  No significant degenerative change is appreciated.  The sacroiliac joints are unremarkable in appearance.  The visualized bowel gas pattern is grossly unremarkable in appearance.  IMPRESSION: No evidence of fracture or dislocation.   Original Report Authenticated By: Tonia Ghent, M.D.    Dg Femur Left  01/19/2013  *RADIOLOGY REPORT*  Clinical Data: Status post fall; left proximal leg pain.  LEFT FEMUR - 2 VIEW  Comparison: Left hip radiographs performed earlier today at 11:10 p.m.  Findings: The left femur appears intact.  There is no evidence of fracture or dislocation.  The left femoral head remains seated at the acetabulum.  An enthesophyte  is noted at the superior pole of the patella.  No significant knee joint effusion is identified; the knee joint is grossly unremarkable in appearance.  The visualized bowel gas pattern is grossly unremarkable.  No significant soft tissue abnormalities are characterized on radiograph.  IMPRESSION: No evidence of fracture or dislocation.   Original Report Authenticated By: Tonia Ghent, M.D.    Ct Head Wo Contrast  01/18/2013  *RADIOLOGY REPORT*  Clinical Data: Status post fall; bilateral headache and pain over the left orbit.  CT HEAD WITHOUT CONTRAST  Technique:  Contiguous axial images were obtained from the base of the skull through the vertex without contrast.  Comparison: CT of the head performed 05/06/2012  Findings: There is no evidence of acute infarction, mass lesion, or intra- or extra-axial hemorrhage on CT.  Scattered periventricular white matter change likely reflects small vessel ischemic microangiopathy.  The posterior fossa, including the cerebellum, brainstem and fourth ventricle, is within normal limits.  The third and lateral ventricles, and basal ganglia are unremarkable in appearance.  The cerebral hemispheres are symmetric in appearance, with normal gray- white differentiation.  No mass effect  or midline shift is seen.  There is no evidence of fracture; visualized osseous structures are unremarkable in appearance.  The visualized portions of the orbits are within normal limits.  The paranasal sinuses and mastoid air cells are well-aerated.  No significant soft tissue abnormalities are seen.  IMPRESSION:  1.  No evidence of traumatic intracranial injury or fracture. 2.  Mild small vessel ischemic microangiopathy.   Original Report Authenticated By: Tonia Ghent, M.D.       1. Fall       MDM  This is a 77 year old female, past medical history remarkable for dementia, who presents with a fall. I believe this fall to be a mechanical fall. As the patient is slightly alternative baseline secondary to the dementia, I'm going to get a CT scan of her brain. Will order routine labs. Patient also complains of some left sided hip pain, therefore will order plain films. I have discussed the patient with Dr. Read Drivers, who will see the patient.   12:37 AM This patient was seen by and discussed with Dr. Read Drivers, who agrees with the plan.  No acute injury found from this mechanical fall. I am going to discharge the patient back to her care center.  She is stable and ready for discharge.        Roxy Horseman, PA-C 01/19/13 0040

## 2013-01-18 NOTE — ED Notes (Signed)
Per EMS: pt from [facility].  States she was in the bathroom when she fell, denies LOC.  C/o left leg, right hip, and left flank pain.  States she has had chronic pain in these areas but worse today.  Pt hypertensive at 200/100.  Hx of htn, ST at 120.

## 2013-01-19 NOTE — ED Provider Notes (Signed)
Medical screening examination/treatment/procedure(s) were conducted as a shared visit with non-physician practitioner(s) and myself.  I personally evaluated the patient during the encounter  Patient awake and alert; inappropriate answers to questions consistent with patient's documented dementia. No apparent pain on passive range of motion of hips bilaterally.  Hanley Seamen, MD 01/19/13 [phone]

## 2013-01-19 NOTE — ED Notes (Signed)
I gave the patient a warm blanket. 

## 2013-01-19 NOTE — ED Provider Notes (Signed)
Medical screening examination/treatment/procedure(s) were performed by non-physician practitioner and as supervising physician I was immediately available for consultation/collaboration.  Derwood Kaplan, MD 01/19/13 1605

## 2013-01-19 NOTE — ED Notes (Signed)
I gave the patient 2 containers of cranberry juice and a pack of graham crackers.

## 2013-01-19 NOTE — ED Notes (Signed)
I gave the patient a second pack of graham crackers.

## 2013-01-27 ENCOUNTER — Encounter (HOSPITAL_COMMUNITY): Payer: Self-pay | Admitting: *Deleted

## 2013-01-27 ENCOUNTER — Emergency Department (HOSPITAL_COMMUNITY): Payer: Medicare Other

## 2013-01-27 ENCOUNTER — Emergency Department (HOSPITAL_COMMUNITY)
Admission: EM | Admit: 2013-01-27 | Discharge: 2013-01-28 | Disposition: A | Discharge status: Home / Self Care | Payer: Medicare Other | Attending: Emergency Medicine | Admitting: Emergency Medicine

## 2013-01-27 DIAGNOSIS — R531 Weakness: Secondary | ICD-10-CM

## 2013-01-27 DIAGNOSIS — R1084 Generalized abdominal pain: Secondary | ICD-10-CM | POA: Insufficient documentation

## 2013-01-27 DIAGNOSIS — E119 Type 2 diabetes mellitus without complications: Secondary | ICD-10-CM | POA: Insufficient documentation

## 2013-01-27 DIAGNOSIS — R079 Chest pain, unspecified: Secondary | ICD-10-CM | POA: Insufficient documentation

## 2013-01-27 DIAGNOSIS — R112 Nausea with vomiting, unspecified: Secondary | ICD-10-CM | POA: Insufficient documentation

## 2013-01-27 DIAGNOSIS — E785 Hyperlipidemia, unspecified: Secondary | ICD-10-CM | POA: Insufficient documentation

## 2013-01-27 DIAGNOSIS — R0602 Shortness of breath: Secondary | ICD-10-CM | POA: Insufficient documentation

## 2013-01-27 DIAGNOSIS — R51 Headache: Secondary | ICD-10-CM | POA: Insufficient documentation

## 2013-01-27 DIAGNOSIS — I10 Essential (primary) hypertension: Secondary | ICD-10-CM | POA: Insufficient documentation

## 2013-01-27 DIAGNOSIS — R059 Cough, unspecified: Secondary | ICD-10-CM | POA: Insufficient documentation

## 2013-01-27 DIAGNOSIS — R5383 Other fatigue: Secondary | ICD-10-CM | POA: Insufficient documentation

## 2013-01-27 DIAGNOSIS — R05 Cough: Secondary | ICD-10-CM

## 2013-01-27 DIAGNOSIS — Z79899 Other long term (current) drug therapy: Secondary | ICD-10-CM | POA: Insufficient documentation

## 2013-01-27 DIAGNOSIS — F0390 Unspecified dementia without behavioral disturbance: Secondary | ICD-10-CM | POA: Insufficient documentation

## 2013-01-27 DIAGNOSIS — R5381 Other malaise: Secondary | ICD-10-CM | POA: Insufficient documentation

## 2013-01-27 LAB — COMPREHENSIVE METABOLIC PANEL
ALT: 14 U/L (ref 0–35)
CO2: 24 mEq/L (ref 19–32)
Calcium: 10.6 mg/dL — ABNORMAL HIGH (ref 8.4–10.5)
GFR calc Af Amer: 62 mL/min — ABNORMAL LOW (ref >90)
GFR calc non Af Amer: 53 mL/min — ABNORMAL LOW (ref >90)
Glucose, Bld: 142 mg/dL — ABNORMAL HIGH (ref 70–99)
Sodium: 133 mEq/L — ABNORMAL LOW (ref 135–145)

## 2013-01-27 LAB — CBC WITH DIFFERENTIAL/PLATELET
Eosinophils Absolute: 0.1 10*3/uL (ref 0.0–0.7)
Eosinophils Relative: 1 % (ref 0–5)
Hemoglobin: 13 g/dL (ref 12.0–15.0)
Lymphs Abs: 2.4 10*3/uL (ref 0.7–4.0)
MCH: 31.1 pg (ref 26.0–34.0)
MCV: 91.1 fL (ref 78.0–100.0)
Monocytes Relative: 8 % (ref 3–12)
RBC: 4.18 MIL/uL (ref 3.87–5.11)

## 2013-01-27 LAB — URINALYSIS, ROUTINE W REFLEX MICROSCOPIC
Glucose, UA: NEGATIVE mg/dL
Hgb urine dipstick: NEGATIVE
Ketones, ur: NEGATIVE mg/dL
Protein, ur: NEGATIVE mg/dL

## 2013-01-27 LAB — VALPROIC ACID LEVEL: Valproic Acid Lvl: 16 ug/mL — ABNORMAL LOW (ref 50.0–100.0)

## 2013-01-27 MED ORDER — SODIUM CHLORIDE 0.9 % IV SOLN
INTRAVENOUS | Status: DC
  Administered 2013-01-27: 20:00:00 via INTRAVENOUS

## 2013-01-27 MED ORDER — IOHEXOL 350 MG/ML SOLN
100.0000 mL | Freq: Once | INTRAVENOUS | Status: AC | PRN
  Administered 2013-01-27: 100 mL via INTRAVENOUS

## 2013-01-27 NOTE — ED Notes (Signed)
Per EMS pt is from [facility]. Pt states that for about the last two weeks she has been experiencing flu like symptoms with a hacking cough and nausea and vomiting that has increasingly gotten worse since yesterday. Per EMS BP190/80, HR 100. Pt is alert and oriented, ambulatory.

## 2013-01-27 NOTE — ED Provider Notes (Signed)
History     CSN: [id number]  Arrival date & time 01/27/13  1751   First MD Initiated Contact with Patient 01/27/13 1844      Chief Complaint  Patient presents with  . Influenza    flu like symptoms    (Consider location/radiation/quality/duration/timing/severity/associated sxs/prior treatment) Patient is a 77 y.o. female presenting with flu symptoms. The history is provided by the patient.  Influenza Associated symptoms include chest pain, headaches and shortness of breath. Pertinent negatives include no abdominal pain.   patient's been feeling bad for last 2-3 weeks. She has had a cough with chest pain. She's also had nausea and vomiting. She states she feels weak all over. She states she's felt as if her blood pressure is high. She also has a headache. She states it feels like her typical migraine headache. No fevers. She's had a decreased appetite. She is now worse since yesterday. She's had mild abdominal pain with it. The cough is productive of some sputum. There is no blood in the stool or emesis.  Past Medical History  Diagnosis Date  . Dementia   . Hypertension   . Hyperlipidemia   . Diabetes mellitus     History reviewed. No pertinent past surgical history.  History reviewed. No pertinent family history.  History  Substance Use Topics  . Smoking status: Never Smoker   . Smokeless tobacco: Not on file  . Alcohol Use: No    OB History    Grav Para Term Preterm Abortions TAB SAB Ect Mult Living                  Review of Systems  Constitutional: Positive for appetite change and fatigue. Negative for activity change.  HENT: Negative for neck stiffness.   Eyes: Negative for pain.  Respiratory: Positive for shortness of breath. Negative for chest tightness.   Cardiovascular: Positive for chest pain. Negative for leg swelling.  Gastrointestinal: Positive for nausea and vomiting. Negative for abdominal pain and diarrhea.  Genitourinary: Negative for dysuria and  flank pain.  Musculoskeletal: Negative for back pain.  Skin: Negative for rash.  Neurological: Positive for headaches. Negative for weakness and numbness.  Psychiatric/Behavioral: Negative for behavioral problems.    Allergies  Lasix; Lipitor; and Soap  Home Medications   Current Outpatient Rx  Name  Route  Sig  Dispense  Refill  . ACETAMINOPHEN 325 MG PO TABS   Oral   Take 650 mg by mouth every 4 (four) hours as needed. For headache or fever         . ALBUTEROL SULFATE (2.5 MG/3ML) 0.083% IN NEBU   Nebulization   Take 2.5 mg by nebulization every 4 (four) hours as needed. For wheezing         . AMLODIPINE BESYLATE 10 MG PO TABS   Oral   Take 10 mg by mouth daily.         Marland Kitchen DIVALPROEX SODIUM ER 250 MG PO TB24   Oral   Take 250 mg by mouth at bedtime.         . GUAIFENESIN-CODEINE 100-10 MG/5ML PO SYRP   Oral   Take 10 mLs by mouth every 6 (six) hours as needed. For cough         . HYDROCHLOROTHIAZIDE 25 MG PO TABS   Oral   Take 25 mg by mouth daily.         Marland Kitchen LOPERAMIDE HCL 2 MG PO CAPS   Oral   Take 2 mg  by mouth daily as needed. For diarrhea         . LORAZEPAM 0.5 MG PO TABS   Oral   Take 1 mg by mouth at bedtime.          Marland Kitchen OLANZAPINE 15 MG PO TABS   Oral   Take 15 mg by mouth at bedtime.         Marland Kitchen OLANZAPINE 2.5 MG PO TABS   Oral   Take 2.5 mg by mouth every morning.         Marland Kitchen RIVASTIGMINE 9.5 MG/24HR TD PT24   Transdermal   Place 1 patch onto the skin daily.         Marland Kitchen SIMVASTATIN 20 MG PO TABS   Oral   Take 20 mg by mouth every evening.         Marland Kitchen SPIRONOLACTONE 25 MG PO TABS   Oral   Take 25 mg by mouth daily.         . TRAMADOL HCL 50 MG PO TABS   Oral   Take 50 mg by mouth 2 (two) times daily. Foot pain.         . TRAZODONE HCL 150 MG PO TABS   Oral   Take 150 mg by mouth at bedtime.           BP 155/66  Pulse 106  Temp 98.3 F (36.8 C) (Oral)  Resp 18  SpO2 97%  Physical Exam  Nursing note and  vitals reviewed. Constitutional: She is oriented to person, place, and time. She appears well-developed and well-nourished.  HENT:  Head: Normocephalic and atraumatic.  Eyes: EOM are normal. Pupils are equal, round, and reactive to light.  Neck: Normal range of motion. Neck supple.  Cardiovascular: Normal rate, regular rhythm and normal heart sounds.   No murmur heard. Pulmonary/Chest: Effort normal and breath sounds normal. No respiratory distress. She has no wheezes. She has no rales.  Abdominal: Soft. Bowel sounds are normal. She exhibits no distension. There is tenderness. There is no rebound and no guarding.       Mild diffuse abdominal pain. Reducible midline hernia above umbilicus.  Musculoskeletal: Normal range of motion. She exhibits no edema.  Neurological: She is alert and oriented to person, place, and time. No cranial nerve deficit.  Skin: Skin is warm and dry.  Psychiatric: She has a normal mood and affect. Her speech is normal.    ED Course  Procedures (including critical care time)  Labs Reviewed  URINALYSIS, ROUTINE W REFLEX MICROSCOPIC - Abnormal; Notable for the following:    Leukocytes, UA MODERATE (*)     All other components within normal limits  COMPREHENSIVE METABOLIC PANEL - Abnormal; Notable for the following:    Sodium 133 (*)     Glucose, Bld 142 (*)     Calcium 10.6 (*)     Total Protein 8.6 (*)     GFR calc non Af Amer 53 (*)     GFR calc Af Amer 62 (*)     All other components within normal limits  VALPROIC ACID LEVEL - Abnormal; Notable for the following:    Valproic Acid Lvl 16.0 (*)     All other components within normal limits  CBC WITH DIFFERENTIAL  LIPASE, BLOOD  TROPONIN I  URINE MICROSCOPIC-ADD ON   Dg Chest 2 View  01/27/2013  *RADIOLOGY REPORT*  Clinical Data: Weakness, cough, history dementia, hypertension, diabetes  CHEST - 2 VIEW  Comparison: 01/18/2013  Findings: Enlargement of cardiac silhouette with pulmonary vascular congestion.  Tortuous aorta. Low lung volumes with bibasilar atelectasis. Upper lungs clear. No pleural effusion or pneumothorax.  IMPRESSION: Enlargement of cardiac silhouette. Bibasilar atelectasis and low lung volumes.   Original Report Authenticated By: Ulyses Southward, M.D.      1. Weakness   2. Cough     Date: 01/27/2013  Rate: 103  Rhythm: sinus tachycardia  QRS Axis: normal  Intervals: normal  ST/T Wave abnormalities: normal  Conduction Disutrbances:none  Narrative Interpretation: early R/S transition. Prominent P waves  Old EKG Reviewed: unchanged     MDM  Patient with multiple complaints. Cough nausea vomiting dysuria. States that she just feels bad. Lab work is overall reassuring except for a mildly low Depakote level. Patient was found to be tachypneic on reexamination with the resting respiratory rate of around 35. CT angiography was ordered to 2 recent fall and leg pain. It is pending at this time.        Juliet Rude. Rubin Payor, MD 01/27/13 2352

## 2013-01-27 NOTE — ED Notes (Signed)
[id number]<AV> Expected date:<BR> Expected time:<BR> Means of arrival:<BR> Comments:<BR> Ems/ flu like sx

## 2013-01-28 MED ORDER — VALPROATE SODIUM 500 MG/5ML IV SOLN
250.0000 mg | Freq: Once | INTRAVENOUS | Status: AC
  Administered 2013-01-28: 250 mg via INTRAVENOUS
  Filled 2013-01-28: qty 2.5

## 2013-04-29 ENCOUNTER — Emergency Department (HOSPITAL_COMMUNITY): Payer: Medicare Other

## 2013-04-29 ENCOUNTER — Encounter (HOSPITAL_COMMUNITY): Payer: Self-pay | Admitting: Emergency Medicine

## 2013-04-29 ENCOUNTER — Inpatient Hospital Stay (HOSPITAL_COMMUNITY)
Admission: EM | Admit: 2013-04-29 | Discharge: 2013-05-01 | DRG: 689 | Disposition: A | Discharge status: Home Health | Payer: Medicare Other | Attending: Internal Medicine | Admitting: Internal Medicine

## 2013-04-29 DIAGNOSIS — E119 Type 2 diabetes mellitus without complications: Secondary | ICD-10-CM

## 2013-04-29 DIAGNOSIS — Z794 Long term (current) use of insulin: Secondary | ICD-10-CM

## 2013-04-29 DIAGNOSIS — N390 Urinary tract infection, site not specified: Principal | ICD-10-CM | POA: Diagnosis present

## 2013-04-29 DIAGNOSIS — E785 Hyperlipidemia, unspecified: Secondary | ICD-10-CM | POA: Diagnosis present

## 2013-04-29 DIAGNOSIS — R4182 Altered mental status, unspecified: Secondary | ICD-10-CM | POA: Diagnosis present

## 2013-04-29 DIAGNOSIS — I10 Essential (primary) hypertension: Secondary | ICD-10-CM | POA: Diagnosis present

## 2013-04-29 DIAGNOSIS — F0390 Unspecified dementia without behavioral disturbance: Secondary | ICD-10-CM | POA: Diagnosis present

## 2013-04-29 DIAGNOSIS — G9349 Other encephalopathy: Secondary | ICD-10-CM | POA: Diagnosis present

## 2013-04-29 LAB — COMPREHENSIVE METABOLIC PANEL
AST: 29 U/L (ref 0–37)
Albumin: 3.5 g/dL (ref 3.5–5.2)
Chloride: 98 mEq/L (ref 96–112)
Creatinine, Ser: 1.14 mg/dL — ABNORMAL HIGH (ref 0.50–1.10)
Potassium: 4.1 mEq/L (ref 3.5–5.1)
Total Bilirubin: 0.4 mg/dL (ref 0.3–1.2)
Total Protein: 7.9 g/dL (ref 6.0–8.3)

## 2013-04-29 LAB — URINALYSIS, ROUTINE W REFLEX MICROSCOPIC
Bilirubin Urine: NEGATIVE
Glucose, UA: NEGATIVE mg/dL
Hgb urine dipstick: NEGATIVE
Specific Gravity, Urine: 1.02 (ref 1.005–1.030)
Urobilinogen, UA: 1 mg/dL (ref 0.0–1.0)
pH: 7 (ref 5.0–8.0)

## 2013-04-29 LAB — URINE MICROSCOPIC-ADD ON

## 2013-04-29 LAB — GLUCOSE, CAPILLARY
Glucose-Capillary: 108 mg/dL — ABNORMAL HIGH (ref 70–99)
Glucose-Capillary: 135 mg/dL — ABNORMAL HIGH (ref 70–99)

## 2013-04-29 LAB — CBC WITH DIFFERENTIAL/PLATELET
Basophils Absolute: 0 10*3/uL (ref 0.0–0.1)
Basophils Relative: 0 % (ref 0–1)
MCHC: 33.7 g/dL (ref 30.0–36.0)
Monocytes Absolute: 0.9 10*3/uL (ref 0.1–1.0)
Neutro Abs: 7.1 10*3/uL (ref 1.7–7.7)
Neutrophils Relative %: 80 % — ABNORMAL HIGH (ref 43–77)
RDW: 12.9 % (ref 11.5–15.5)

## 2013-04-29 LAB — POCT I-STAT TROPONIN I: Troponin i, poc: 0 ng/mL (ref 0.00–0.08)

## 2013-04-29 LAB — CG4 I-STAT (LACTIC ACID): Lactic Acid, Venous: 2.35 mmol/L — ABNORMAL HIGH (ref 0.5–2.2)

## 2013-04-29 LAB — PRO B NATRIURETIC PEPTIDE: Pro B Natriuretic peptide (BNP): 87.8 pg/mL (ref 0–125)

## 2013-04-29 LAB — VALPROIC ACID LEVEL: Valproic Acid Lvl: 24.3 ug/mL — ABNORMAL LOW (ref 50.0–100.0)

## 2013-04-29 MED ORDER — HYDROCHLOROTHIAZIDE 25 MG PO TABS
25.0000 mg | ORAL_TABLET | Freq: Every day | ORAL | Status: DC
  Administered 2013-04-30 – 2013-05-01 (×2): 25 mg via ORAL
  Filled 2013-04-29 (×2): qty 1

## 2013-04-29 MED ORDER — PANTOPRAZOLE SODIUM 40 MG PO TBEC
40.0000 mg | DELAYED_RELEASE_TABLET | Freq: Every day | ORAL | Status: DC
  Administered 2013-04-29 – 2013-05-01 (×3): 40 mg via ORAL
  Filled 2013-04-29 (×3): qty 1

## 2013-04-29 MED ORDER — ONDANSETRON HCL 4 MG PO TABS: 4.0000 mg | ORAL_TABLET | Freq: Four times a day (QID) | ORAL | Status: DC | PRN

## 2013-04-29 MED ORDER — SODIUM CHLORIDE 0.9 % IV SOLN
INTRAVENOUS | Status: DC
  Administered 2013-04-29 – 2013-05-01 (×3): via INTRAVENOUS

## 2013-04-29 MED ORDER — ENOXAPARIN SODIUM 40 MG/0.4ML ~~LOC~~ SOLN
40.0000 mg | SUBCUTANEOUS | Status: DC
  Administered 2013-04-29 – 2013-04-30 (×2): 40 mg via SUBCUTANEOUS
  Filled 2013-04-29 (×3): qty 0.4

## 2013-04-29 MED ORDER — AMLODIPINE BESYLATE 10 MG PO TABS
10.0000 mg | ORAL_TABLET | Freq: Every day | ORAL | Status: DC
  Administered 2013-04-30 – 2013-05-01 (×2): 10 mg via ORAL
  Filled 2013-04-29 (×2): qty 1

## 2013-04-29 MED ORDER — DIVALPROEX SODIUM ER 250 MG PO TB24
250.0000 mg | ORAL_TABLET | Freq: Every day | ORAL | Status: DC
  Administered 2013-04-29 – 2013-04-30 (×2): 250 mg via ORAL
  Filled 2013-04-29 (×3): qty 1

## 2013-04-29 MED ORDER — DEXTROSE 5 % IV SOLN
1.0000 g | INTRAVENOUS | Status: DC
  Administered 2013-04-29: 1 g via INTRAVENOUS
  Filled 2013-04-29: qty 10

## 2013-04-29 MED ORDER — INSULIN ASPART 100 UNIT/ML ~~LOC~~ SOLN
0.0000 [IU] | Freq: Three times a day (TID) | SUBCUTANEOUS | Status: DC
  Administered 2013-04-29: 2 [IU] via SUBCUTANEOUS
  Administered 2013-04-30: 3 [IU] via SUBCUTANEOUS
  Administered 2013-05-01: 2 [IU] via SUBCUTANEOUS

## 2013-04-29 MED ORDER — ACETAMINOPHEN 325 MG PO TABS: 650.0000 mg | ORAL_TABLET | Freq: Four times a day (QID) | ORAL | Status: DC | PRN

## 2013-04-29 MED ORDER — SODIUM CHLORIDE 0.9 % IV SOLN
INTRAVENOUS | Status: DC
  Administered 2013-04-29: 04:00:00 via INTRAVENOUS

## 2013-04-29 MED ORDER — TRAZODONE HCL 150 MG PO TABS
150.0000 mg | ORAL_TABLET | Freq: Every day | ORAL | Status: DC
  Administered 2013-04-29 – 2013-04-30 (×2): 150 mg via ORAL
  Filled 2013-04-29 (×3): qty 1

## 2013-04-29 MED ORDER — CEFTRIAXONE SODIUM 1 G IJ SOLR
1.0000 g | INTRAMUSCULAR | Status: DC
  Administered 2013-04-30: 1 g via INTRAVENOUS
  Filled 2013-04-29: qty 10

## 2013-04-29 MED ORDER — ONDANSETRON HCL 4 MG/2ML IJ SOLN: 4.0000 mg | Freq: Four times a day (QID) | INTRAMUSCULAR | Status: DC | PRN

## 2013-04-29 MED ORDER — ALBUTEROL SULFATE (5 MG/ML) 0.5% IN NEBU: 2.5000 mg | INHALATION_SOLUTION | RESPIRATORY_TRACT | Status: DC | PRN

## 2013-04-29 MED ORDER — OLANZAPINE 10 MG PO TABS
10.0000 mg | ORAL_TABLET | Freq: Every morning | ORAL | Status: DC
  Administered 2013-04-29 – 2013-05-01 (×3): 10 mg via ORAL
  Filled 2013-04-29 (×3): qty 1

## 2013-04-29 MED ORDER — TRAMADOL HCL 50 MG PO TABS
50.0000 mg | ORAL_TABLET | Freq: Two times a day (BID) | ORAL | Status: DC
  Administered 2013-04-29 – 2013-05-01 (×5): 50 mg via ORAL
  Filled 2013-04-29 (×6): qty 1

## 2013-04-29 MED ORDER — SPIRONOLACTONE 25 MG PO TABS
25.0000 mg | ORAL_TABLET | Freq: Every day | ORAL | Status: DC
  Administered 2013-04-29 – 2013-05-01 (×3): 25 mg via ORAL
  Filled 2013-04-29 (×3): qty 1

## 2013-04-29 MED ORDER — LORAZEPAM 1 MG PO TABS
1.0000 mg | ORAL_TABLET | Freq: Every day | ORAL | Status: DC
  Administered 2013-04-30: 1 mg via ORAL
  Filled 2013-04-29: qty 1

## 2013-04-29 NOTE — Progress Notes (Signed)
[facility] History and Physical  Katrina Burton ZOX:[id number] DOB: 02-08-1936 DOA: 04/29/2013  Referring physician: Dr. Freida Busman PCP: Default, Provider, MD  Specialists:   Chief Complaint: Altered mental status  HPI: Katrina Burton is a 77 y.o. female resident of [facility] with history of hypertension, diabetes and dementia brought to ED per EMS  With complaints of fever and altered mental status. The history is obtained from ED records and nursing staff. It was reported that patient had a fever at about 1:30 AM and staff at her facility also reported that she had some confusion. At the time of my exam she is sleepy but easily aroused and oriented x3 and admits to, dysuria and lower abdominal discomfort. Urinalysis done in the ED showed a 21-50 WBCs and large leukocyte esterase although many squamous cells noted. Lactic acid level was mildly elevated at 2.35. She had a temperature 100.6 in the ED, was hemodynamically stable. She was given Rocephin and admission to hospitalist service requested.   Review of Systems: As per history of present illness, otherwise unobtainable. Past Medical History  Diagnosis Date  . Dementia   . Hypertension   . Hyperlipidemia   . Diabetes mellitus    History reviewed. No pertinent past surgical history. Social History:  reports that she has never smoked. She has never used smokeless tobacco. She reports that she does not drink alcohol or use illicit drugs.   Allergies  Allergen Reactions  . Lasix (Furosemide)     hives  . Lipitor (Atorvastatin)     Hives   . Soap     Certain types of soap    Family history: Unobtainable secondary to patient's altered mental status.  Prior to Admission medications   Medication Sig Start Date End Date Taking? Authorizing Provider  acetaminophen (TYLENOL) 325 MG tablet Take 650 mg by mouth every 4 (four) hours as needed. For headache or fever   Yes Historical Provider, MD  albuterol  (PROVENTIL) (2.5 MG/3ML) 0.083% nebulizer solution Take 2.5 mg by nebulization every 4 (four) hours as needed. For wheezing   Yes Historical Provider, MD  amLODipine (NORVASC) 10 MG tablet Take 10 mg by mouth daily.   Yes Historical Provider, MD  divalproex (DEPAKOTE ER) 250 MG 24 hr tablet Take 250 mg by mouth at bedtime.   Yes Historical Provider, MD  hydrochlorothiazide (HYDRODIURIL) 25 MG tablet Take 25 mg by mouth daily.   Yes Historical Provider, MD  loperamide (IMODIUM) 2 MG capsule Take 2 mg by mouth daily as needed. For diarrhea   Yes Historical Provider, MD  LORazepam (ATIVAN) 0.5 MG tablet Take 1 mg by mouth at bedtime.    Yes Historical Provider, MD  OLANZapine (ZYPREXA) 10 MG tablet Take 10 mg by mouth every morning.   Yes Historical Provider, MD  omeprazole (PRILOSEC) 40 MG capsule Take 40 mg by mouth daily before breakfast.   Yes Historical Provider, MD  spironolactone (ALDACTONE) 25 MG tablet Take 25 mg by mouth daily.   Yes Historical Provider, MD  traMADol (ULTRAM) 50 MG tablet Take 50 mg by mouth 2 (two) times daily. Foot pain.   Yes Historical Provider, MD  traZODone (DESYREL) 150 MG tablet Take 150 mg by mouth at bedtime.   Yes Historical Provider, MD   Physical Exam: Filed Vitals:   04/29/13 0630 04/29/13 0646 04/29/13 0836 04/29/13 1100  BP: 129/68  141/80 143/69  Pulse: 74  78 79  Temp:  98.6 F (37 C)    TempSrc:  Oral    Resp: 18  81 16  SpO2: 95%  95% 95%   Constitutional: Vital signs reviewed.  Patient is a well-developed and well-nourished  in no acute distress and cooperative with exam.  sleepy but easily aroused and oriented x3.  Head: Normocephalic and atraumatic Mouth: no erythema or exudates, MMM Eyes: PERRL, EOMI, conjunctivae normal, No scleral icterus.  Neck: Supple, Trachea midline normal ROM, No JVD, mass, thyromegaly, or carotid bruit present.  Cardiovascular: RRR, S1 normal, S2 normal, no MRG, pulses symmetric and intact  bilaterally Pulmonary/Chest: CTAB, no wheezes, rales, or rhonchi Abdominal: Soft. Suprapubic tenderness present, non-distended, bowel sounds are normal, no masses, organomegaly, or guarding present.  GU: no CVA tenderness Musculoskeletal: No joint deformities, erythema, or stiffness, ROM full and no nontender Hematology: no cervical, inginal, or axillary adenopathy.  Neurological: sleepy but easily aroused and oriented x3, Strength is normal and symmetric bilaterally, cranial nerve II-XII are grossly intact, no focal motor deficit, sensory intact to light touch bilaterally.  Skin: Warm, dry and intact. No rash.    Labs on Admission:  Basic Metabolic Panel:  Recent Labs Lab 04/29/13 0408  NA 134*  K 4.1  CL 98  CO2 26  GLUCOSE 137*  BUN 16  CREATININE 1.14*  CALCIUM 10.0   Liver Function Tests:  Recent Labs Lab 04/29/13 0408  AST 29  ALT 16  ALKPHOS 94  BILITOT 0.4  PROT 7.9  ALBUMIN 3.5   No results found for this basename: LIPASE, AMYLASE,  in the last 168 hours No results found for this basename: AMMONIA,  in the last 168 hours CBC:  Recent Labs Lab 04/29/13 0408  WBC 8.9  NEUTROABS 7.1  HGB 11.4*  HCT 33.8*  MCV 90.4  PLT 207   Cardiac Enzymes: No results found for this basename: CKTOTAL, CKMB, CKMBINDEX, TROPONINI,  in the last 168 hours  BNP (last 3 results)  Recent Labs  04/29/13 0408  PROBNP 87.8   CBG: No results found for this basename: GLUCAP,  in the last 168 hours  Radiological Exams on Admission: Dg Chest Port 1 View  04/29/2013  *RADIOLOGY REPORT*  Clinical Data: 77 year old female with chest pain.  PORTABLE CHEST - 1 VIEW  Comparison: Chest CTA 01/27/2013 and earlier.  Findings: Portable AP supine view at 0358 hours.  Similar low lung volumes. Stable cardiomegaly and mediastinal contours.  Diffuse increased interstitial markings appear related to vascular congestion.  No pneumothorax, pleural effusion or confluent pulmonary opacity.   IMPRESSION: Low lung volumes with pulmonary vascular congestion/interstitial edema.   Original Report Authenticated By: Erskine Speed, M.D.       Assessment/Plan Active Problems: UTI (lower urinary tract infection) -As discussed above, although she has many squamous the cells on the UA, she is febrile has suprapubic tenderness and admits to dysuria-which are all clinically consistent with a urinary tract infection. -Blood and urine cultures obtained are, continue empiric antibiotics with Rocephin and follow.  Altered mental status/acute encephalopathy -Likely secondary to #1 in this patient with a history of dementia   Hypertension -Monitor blood pressures, continue outpatient medications and further treat accordingly.   Dementia/psych -Continue outpatient   DM (diabetes mellitus) -No diabetes meds on her list, will obtain hemoglobin A1c monitor Accu-Cheks and cover with sliding scale insulin.   Code Status: Presumed full Family Communication: none at bedside Disposition Plan: admit to [facility]  Time spent: >30MINS  Kela Millin [facility] Pager [phone]  If 7PM-7AM, please contact night-coverage [email or web address] Password  [email or web address] 04/29/2013, 12:24 PM

## 2013-04-29 NOTE — ED Notes (Signed)
Brought in by EMS from [facility] with c/o fever and altered mental status.  Per EMS, staff at the facility reported that pt c/o headache with "fever" at around 1:30AM today, pt was given Tylenol 1000 mg po; staff at the facility also reported that pt has been exhibiting some "confusion", pt has dementia.

## 2013-04-29 NOTE — ED Provider Notes (Signed)
History     CSN: [id number]  Arrival date & time 04/29/13  4098   First MD Initiated Contact with Patient 04/29/13 0340      Chief Complaint  Patient presents with  . Altered Mental Status    (Consider location/radiation/quality/duration/timing/severity/associated sxs/prior treatment) Patient is a 77 y.o. female presenting with altered mental status. The history is provided by the patient and the EMS personnel. The history is limited by the condition of the patient.  Altered Mental Status   patient here from nursing home with altered mental status. Patient reportedly had a fever with headache proximally 2 and half hours prior to arrival. She was given a gram of Tylenol prior to arrival. She has been more confused but does have a baseline history dementia. Productive cough is also noted. No reported vomiting or diarrhea. EMS was called and patient transported here  Past Medical History  Diagnosis Date  . Dementia   . Hypertension   . Hyperlipidemia   . Diabetes mellitus     History reviewed. No pertinent past surgical history.  History reviewed. No pertinent family history.  History  Substance Use Topics  . Smoking status: Never Smoker   . Smokeless tobacco: Not on file  . Alcohol Use: No    OB History   Grav Para Term Preterm Abortions TAB SAB Ect Mult Living                  Review of Systems  Unable to perform ROS Psychiatric/Behavioral: Positive for altered mental status.    Allergies  Lasix; Lipitor; and Soap  Home Medications   Current Outpatient Rx  Name  Route  Sig  Dispense  Refill  . acetaminophen (TYLENOL) 325 MG tablet   Oral   Take 650 mg by mouth every 4 (four) hours as needed. For headache or fever         . albuterol (PROVENTIL) (2.5 MG/3ML) 0.083% nebulizer solution   Nebulization   Take 2.5 mg by nebulization every 4 (four) hours as needed. For wheezing         . amLODipine (NORVASC) 10 MG tablet   Oral   Take 10 mg by mouth  daily.         . divalproex (DEPAKOTE ER) 250 MG 24 hr tablet   Oral   Take 250 mg by mouth at bedtime.         . hydrochlorothiazide (HYDRODIURIL) 25 MG tablet   Oral   Take 25 mg by mouth daily.         Marland Kitchen loperamide (IMODIUM) 2 MG capsule   Oral   Take 2 mg by mouth daily as needed. For diarrhea         . LORazepam (ATIVAN) 0.5 MG tablet   Oral   Take 1 mg by mouth at bedtime.          Marland Kitchen OLANZapine (ZYPREXA) 10 MG tablet   Oral   Take 10 mg by mouth every morning.         Marland Kitchen omeprazole (PRILOSEC) 40 MG capsule   Oral   Take 40 mg by mouth daily before breakfast.         . spironolactone (ALDACTONE) 25 MG tablet   Oral   Take 25 mg by mouth daily.         . traMADol (ULTRAM) 50 MG tablet   Oral   Take 50 mg by mouth 2 (two) times daily. Foot pain.         Marland Kitchen  traZODone (DESYREL) 150 MG tablet   Oral   Take 150 mg by mouth at bedtime.           BP 139/67  Pulse 100  Temp(Src) 100.6 F (38.1 C) (Rectal)  Resp 20  SpO2 96%  Physical Exam  Nursing note and vitals reviewed. Constitutional: She is oriented to person, place, and time. She appears well-developed and well-nourished.  Non-toxic appearance. No distress.  HENT:  Head: Normocephalic and atraumatic.  Eyes: Conjunctivae, EOM and lids are normal. Pupils are equal, round, and reactive to light.  Neck: Normal range of motion. Neck supple. No tracheal deviation present. No mass present.  Cardiovascular: Normal rate, regular rhythm and normal heart sounds.  Exam reveals no gallop.   No murmur heard. Pulmonary/Chest: Effort normal. No stridor. No respiratory distress. She has decreased breath sounds. She has no wheezes. She has no rhonchi. She has no rales.  Abdominal: Soft. Normal appearance and bowel sounds are normal. She exhibits no distension. There is no tenderness. There is no rebound and no CVA tenderness.  Musculoskeletal: Normal range of motion. She exhibits no edema and no tenderness.   Neurological: She is alert and oriented to person, place, and time. She has normal strength. No cranial nerve deficit or sensory deficit. GCS eye subscore is 4. GCS verbal subscore is 5. GCS motor subscore is 6.  Skin: Skin is warm and dry. No abrasion and no rash noted.  Psychiatric: She has a normal mood and affect. Her speech is normal and behavior is normal.    ED Course  Procedures (including critical care time)  Labs Reviewed  CULTURE, BLOOD (ROUTINE X 2)  CULTURE, BLOOD (ROUTINE X 2)  CBC WITH DIFFERENTIAL  COMPREHENSIVE METABOLIC PANEL  URINALYSIS, ROUTINE W REFLEX MICROSCOPIC   No results found.   No diagnosis found.    MDM   Date: 04/29/2013  Rate: 80  Rhythm: normal sinus rhythm  QRS Axis: normal  Intervals: normal  ST/T Wave abnormalities: nonspecific ST changes  Conduction Disutrbances:none  Narrative Interpretation:   Old EKG Reviewed: unchanged   Patient started on Rocephin for her UTI and will be admitted by [facility].       Toy Baker, MD 04/29/13 [phone]

## 2013-04-29 NOTE — Evaluation (Signed)
Received call from EDP about pt with AMS. Pt brought via EMS. Notable findings include lactate 2.35, UA concerning for UTI, and mild vascular congestion on CXR. Pt will need admission for treatment of UTI and further w/u of AMS if necessary.

## 2013-04-29 NOTE — ED Notes (Signed)
[id number]<AV> Expected date:<BR> Expected time:<BR> Means of arrival:<BR> Comments:<BR> EMS from [facility], fever

## 2013-04-29 NOTE — ED Notes (Signed)
Floor Unit RN unavailable to take report on pt at this time. 

## 2013-04-30 LAB — BASIC METABOLIC PANEL
BUN: 16 mg/dL (ref 6–23)
Chloride: 102 mEq/L (ref 96–112)
GFR calc non Af Amer: 50 mL/min — ABNORMAL LOW (ref >90)
Glucose, Bld: 114 mg/dL — ABNORMAL HIGH (ref 70–99)
Potassium: 3.8 mEq/L (ref 3.5–5.1)
Sodium: 134 mEq/L — ABNORMAL LOW (ref 135–145)

## 2013-04-30 LAB — GLUCOSE, CAPILLARY
Glucose-Capillary: 99 mg/dL (ref 70–99)
Glucose-Capillary: 131 mg/dL — ABNORMAL HIGH (ref 70–99)

## 2013-04-30 LAB — URINE CULTURE

## 2013-04-30 MED ORDER — CIPROFLOXACIN HCL 250 MG PO TABS
250.0000 mg | ORAL_TABLET | Freq: Two times a day (BID) | ORAL | Status: DC
  Administered 2013-04-30 – 2013-05-01 (×2): 250 mg via ORAL
  Filled 2013-04-30 (×4): qty 1

## 2013-04-30 NOTE — Progress Notes (Signed)
Patient sleeping soundly

## 2013-04-30 NOTE — Progress Notes (Signed)
[facility]             Progress Note   Subjective: No complaints. Not confused but appears a little slow.  Objective: Vital signs in last 24 hours: Temp:  [98.4 F (36.9 C)-98.6 F (37 C)] 98.4 F (36.9 C) (05/07 0641) Pulse Rate:  [68-75] 68 (05/07 0641) Resp:  [16] 16 (05/07 0641) BP: (113-145)/(74-84) 113/74 mmHg (05/07 0641) SpO2:  [96 %-97 %] 97 % (05/07 0641) Weight change:  Last BM Date: 04/30/13  Intake/Output from previous day: 05/06 0701 - 05/07 0700 In: 960 [P.O.:960] Out: 750 [Urine:750] Total I/O In: 500 [P.O.:500] Out: 750 [Urine:750]   Physical Exam: General: Alert, awake, oriented x3, in no acute distress. HEENT: No bruits, no goiter. Heart: Regular rate and rhythm, without murmurs, rubs, gallops. Lungs: Clear to auscultation bilaterally. Abdomen: Soft, nontender, nondistended, positive bowel sounds. Extremities: No clubbing cyanosis or edema with positive pedal pulses. Neuro: Grossly intact, nonfocal.    Lab Results: Basic Metabolic Panel:  Recent Labs  78/29/56 0408 04/30/13 0505  NA 134* 134*  K 4.1 3.8  CL 98 102  CO2 26 27  GLUCOSE 137* 114*  BUN 16 16  CREATININE 1.14* 1.06  CALCIUM 10.0 9.2   Liver Function Tests:  Recent Labs  04/29/13 0408  AST 29  ALT 16  ALKPHOS 94  BILITOT 0.4  PROT 7.9  ALBUMIN 3.5   CBC:  Recent Labs  04/29/13 0408  WBC 8.9  NEUTROABS 7.1  HGB 11.4*  HCT 33.8*  MCV 90.4  PLT 207   BNP:  Recent Labs  04/29/13 0408  PROBNP 87.8   CBG:  Recent Labs  04/29/13 1329 04/29/13 2200  GLUCAP 108* 135*   Hemoglobin A1C:  Recent Labs  04/29/13 1430  HGBA1C 6.2*   Urine Drug Screen: Drugs of Abuse     Component Value Date/Time   LABOPIA NONE DETECTED 05/16/2012 1300   COCAINSCRNUR NONE DETECTED 05/16/2012 1300   LABBENZ NONE DETECTED 05/16/2012 1300   AMPHETMU NONE DETECTED 05/16/2012 1300   THCU NONE DETECTED 05/16/2012 1300   LABBARB NONE DETECTED 05/16/2012  1300    Urinalysis:  Recent Labs  04/29/13 0341  COLORURINE YELLOW  LABSPEC 1.020  PHURINE 7.0  GLUCOSEU NEGATIVE  HGBUR NEGATIVE  BILIRUBINUR NEGATIVE  KETONESUR NEGATIVE  PROTEINUR NEGATIVE  UROBILINOGEN 1.0  NITRITE NEGATIVE  LEUKOCYTESUR LARGE*    Recent Results (from the past 240 hour(s))  URINE CULTURE     Status: None   Collection Time    04/29/13  3:41 AM      Result Value Range Status   Specimen Description URINE, CLEAN CATCH   Final   Special Requests NONE   Final   Culture  Setup Time 04/29/2013 10:31   Final   Colony Count >=100,000 COLONIES/ML   Final   Culture     Final   Value: Multiple bacterial morphotypes present, none predominant. Suggest appropriate recollection if clinically indicated.   Report Status 04/30/2013 FINAL   Final  CULTURE, BLOOD (ROUTINE X 2)     Status: None   Collection Time    04/29/13  4:15 AM      Result Value Range Status   Specimen Description BLOOD LEFT FOREARM   Final   Special Requests BOTTLES DRAWN AEROBIC AND ANAEROBIC 5CC   Final   Culture  Setup Time 04/29/2013 10:31   Final   Culture     Final   Value:  BLOOD CULTURE RECEIVED NO GROWTH TO DATE CULTURE WILL BE HELD FOR 5 DAYS BEFORE ISSUING A FINAL NEGATIVE REPORT   Report Status PENDING   Incomplete  CULTURE, BLOOD (ROUTINE X 2)     Status: None   Collection Time    04/29/13  4:28 AM      Result Value Range Status   Specimen Description BLOOD RIGHT HAND   Final   Special Requests BOTTLES DRAWN AEROBIC AND ANAEROBIC 3CC   Final   Culture  Setup Time 04/29/2013 10:32   Final   Culture     Final   Value:        BLOOD CULTURE RECEIVED NO GROWTH TO DATE CULTURE WILL BE HELD FOR 5 DAYS BEFORE ISSUING A FINAL NEGATIVE REPORT   Report Status PENDING   Incomplete    Studies/Results: Dg Chest Port 1 View  04/29/2013  *RADIOLOGY REPORT*  Clinical Data: 77 year old female with chest pain.  PORTABLE CHEST - 1 VIEW  Comparison: Chest CTA 01/27/2013 and earlier.  Findings:  Portable AP supine view at 0358 hours.  Similar low lung volumes. Stable cardiomegaly and mediastinal contours.  Diffuse increased interstitial markings appear related to vascular congestion.  No pneumothorax, pleural effusion or confluent pulmonary opacity.  IMPRESSION: Low lung volumes with pulmonary vascular congestion/interstitial edema.   Original Report Authenticated By: Erskine Speed, M.D.     Medications: Scheduled Meds: . amLODipine  10 mg Oral Daily  . cefTRIAXone (ROCEPHIN)  IV  1 g Intravenous Q24H  . divalproex  250 mg Oral QHS  . enoxaparin (LOVENOX) injection  40 mg Subcutaneous Q24H  . hydrochlorothiazide  25 mg Oral Daily  . insulin aspart  0-15 Units Subcutaneous TID WC  . LORazepam  1 mg Oral QHS  . OLANZapine  10 mg Oral q morning - 10a  . pantoprazole  40 mg Oral Daily  . spironolactone  25 mg Oral Daily  . traMADol  50 mg Oral BID  . traZODone  150 mg Oral QHS   Continuous Infusions: . sodium chloride 100 mL/hr at 04/30/13 1005   PRN Meds:.acetaminophen, albuterol, ondansetron (ZOFRAN) IV, ondansetron  Assessment/Plan:  Active Problems:   Hypertension   Dementia   UTI (lower urinary tract infection)   Altered mental status   DM (diabetes mellitus)   Acute encephalopathy -Suspect related to UTI. -Improving.  ?Urinary tract infection -Culture with multiple morphotypes. -Given she was symptomatic I will treat her with 5 days of antibiotics. Will use Cipro.  Diabetes mellitus -Well controlled. -Continue current regimen.  Disposition -Patient from assisted living facility, awaiting PT/OT evaluations to see if she requires a higher level of care.   Time spent coordinating care: 35 minutes   LOS: 1 day   HERNANDEZ ACOSTA,ESTELA [facility] Pager: [phone] 04/30/2013, 3:23 PM

## 2013-04-30 NOTE — Progress Notes (Deleted)
Attempted to assist patient with ordering his breakfast for second time this am but patient states he will wait for his sister to order when she arrives.

## 2013-04-30 NOTE — Progress Notes (Signed)
Patient has sat up in recliner most of day without problems.  Assisted to bathroom to urinate, ambulation a bit shaky but did well with assistance of one.  Ate her meals without nausea/vomiting today.  Offered to assist back to bed but requested to stay in chair for bit longer and has asked that she be given a walker before discharge even though she has not used one before admission

## 2013-04-30 NOTE — Progress Notes (Signed)
Clinical Social Work  Per chart review, patient is from [facility]. RN reports that patient is immobile and requiring extensive assistance. MD agreed to order PT evaluation. CSW spoke with ALF who reports that prior to admission, patient was independent. Patient bathed herself and ambulated without any assistance and without any equipment. CSW shared this information with RN. CSW will continue to follow and will assess patient after PT evaluation to assist with DC planning.  [place], [place] [phone]

## 2013-05-01 LAB — GLUCOSE, CAPILLARY
Glucose-Capillary: 103 mg/dL — ABNORMAL HIGH (ref 70–99)
Glucose-Capillary: 146 mg/dL — ABNORMAL HIGH (ref 70–99)

## 2013-05-01 LAB — BASIC METABOLIC PANEL
CO2: 27 mEq/L (ref 19–32)
Calcium: 9.7 mg/dL (ref 8.4–10.5)
GFR calc non Af Amer: 56 mL/min — ABNORMAL LOW (ref >90)
Glucose, Bld: 121 mg/dL — ABNORMAL HIGH (ref 70–99)
Potassium: 4.2 mEq/L (ref 3.5–5.1)
Sodium: 137 mEq/L (ref 135–145)

## 2013-05-01 LAB — CBC
Hemoglobin: 11.4 g/dL — ABNORMAL LOW (ref 12.0–15.0)
MCV: 92 fL (ref 78.0–100.0)
Platelets: 203 10*3/uL (ref 150–400)
RBC: 3.74 MIL/uL — ABNORMAL LOW (ref 3.87–5.11)
WBC: 6.1 10*3/uL (ref 4.0–10.5)

## 2013-05-01 MED ORDER — CIPROFLOXACIN HCL 250 MG PO TABS: 250.0000 mg | ORAL_TABLET | Freq: Two times a day (BID) | ORAL | Status: DC

## 2013-05-01 MED ORDER — LORAZEPAM 0.5 MG PO TABS: 1.0000 mg | ORAL_TABLET | Freq: Every day | ORAL | Status: DC

## 2013-05-01 MED ORDER — TRAMADOL HCL 50 MG PO TABS: 50.0000 mg | ORAL_TABLET | Freq: Two times a day (BID) | ORAL | Status: DC

## 2013-05-01 NOTE — Discharge Summary (Signed)
Physician Discharge Summary  Katrina Burton ZOX:[id number] DOB: Sep 09, 1936 DOA: 04/29/2013  PCP: Default, Provider, MD  Admit date: 04/29/2013 Discharge date: 05/01/2013  Time spent: Greater than 30 minutes  Recommendations for Outpatient Follow-up:  Will need to followup with her primary care provider in approximately 2 weeks for hospital followup.   Discharge Diagnoses:  Active Problems:   Hypertension   Dementia   UTI (lower urinary tract infection)   Altered mental status   DM (diabetes mellitus)   Discharge Condition: Stable and improved.  Filed Weights   04/29/13 1351  Weight: 92.5 kg (203 lb 14.8 oz)    History of present illness:  Patient is a 77 y.o. female resident of  living center with history of hypertension, diabetes and dementia brought to ED per EMS With complaints of fever and altered mental status. The history is obtained from ED records and nursing staff. It was reported that patient had a fever at about 1:30 AM and staff at her facility also reported that she had some confusion. At the time of my exam she is sleepy but easily aroused and oriented x3 and admits to, dysuria and lower abdominal discomfort. Urinalysis done in the ED showed a 21-50 WBCs and large leukocyte esterase although many squamous cells noted. Lactic acid level was mildly elevated at 2.35. She had a temperature 100.6 in the ED, was hemodynamically stable. She was given Rocephin and admission to hospitalist service requested.   Hospital Course:   Acute encephalopathy  -Suspect related to UTI.  -Resolved. Back to baseline.  ?Urinary tract infection  -Culture with multiple morphotypes.  -Given she was symptomatic I will treat her with 5 days of antibiotics. Will use Cipro.   Diabetes mellitus  -Well controlled.  -Continue current regimen.   Procedures:  None   Consultations:  None  Discharge Instructions  Discharge Orders   Future Orders Complete By Expires     Discontinue IV  As directed     Increase activity slowly  As directed         Medication List    TAKE these medications       acetaminophen 325 MG tablet  Commonly known as:  TYLENOL  Take 650 mg by mouth every 4 (four) hours as needed. For headache or fever     albuterol (2.5 MG/3ML) 0.083% nebulizer solution  Commonly known as:  PROVENTIL  Take 2.5 mg by nebulization every 4 (four) hours as needed. For wheezing     amLODipine 10 MG tablet  Commonly known as:  NORVASC  Take 10 mg by mouth daily.     ciprofloxacin 250 MG tablet  Commonly known as:  CIPRO  Take 1 tablet (250 mg total) by mouth 2 (two) times daily. For 5 days.     divalproex 250 MG 24 hr tablet  Commonly known as:  DEPAKOTE ER  Take 250 mg by mouth at bedtime.     hydrochlorothiazide 25 MG tablet  Commonly known as:  HYDRODIURIL  Take 25 mg by mouth daily.     loperamide 2 MG capsule  Commonly known as:  IMODIUM  Take 2 mg by mouth daily as needed. For diarrhea     LORazepam 0.5 MG tablet  Commonly known as:  ATIVAN  Take 2 tablets (1 mg total) by mouth at bedtime.     OLANZapine 10 MG tablet  Commonly known as:  ZYPREXA  Take 10 mg by mouth every morning.     omeprazole 40 MG capsule  Commonly known as:  PRILOSEC  Take 40 mg by mouth daily before breakfast.     spironolactone 25 MG tablet  Commonly known as:  ALDACTONE  Take 25 mg by mouth daily.     traMADol 50 MG tablet  Commonly known as:  ULTRAM  Take 1 tablet (50 mg total) by mouth 2 (two) times daily. Foot pain.     traZODone 150 MG tablet  Commonly known as:  DESYREL  Take 150 mg by mouth at bedtime.       Allergies  Allergen Reactions  . Lasix (Furosemide)     hives  . Lipitor (Atorvastatin)     Hives   . Soap     Certain types of soap      The results of significant diagnostics from this hospitalization (including imaging, microbiology, ancillary and laboratory) are listed below for reference.    Significant  Diagnostic Studies: Dg Chest Port 1 View  04/29/2013  *RADIOLOGY REPORT*  Clinical Data: 77 year old female with chest pain.  PORTABLE CHEST - 1 VIEW  Comparison: Chest CTA 01/27/2013 and earlier.  Findings: Portable AP supine view at 0358 hours.  Similar low lung volumes. Stable cardiomegaly and mediastinal contours.  Diffuse increased interstitial markings appear related to vascular congestion.  No pneumothorax, pleural effusion or confluent pulmonary opacity.  IMPRESSION: Low lung volumes with pulmonary vascular congestion/interstitial edema.   Original Report Authenticated By: Erskine Speed, M.D.     Microbiology: Recent Results (from the past 240 hour(s))  URINE CULTURE     Status: None   Collection Time    04/29/13  3:41 AM      Result Value Range Status   Specimen Description URINE, CLEAN CATCH   Final   Special Requests NONE   Final   Culture  Setup Time 04/29/2013 10:31   Final   Colony Count >=100,000 COLONIES/ML   Final   Culture     Final   Value: Multiple bacterial morphotypes present, none predominant. Suggest appropriate recollection if clinically indicated.   Report Status 04/30/2013 FINAL   Final  CULTURE, BLOOD (ROUTINE X 2)     Status: None   Collection Time    04/29/13  4:15 AM      Result Value Range Status   Specimen Description BLOOD LEFT FOREARM   Final   Special Requests BOTTLES DRAWN AEROBIC AND ANAEROBIC 5CC   Final   Culture  Setup Time 04/29/2013 10:31   Final   Culture     Final   Value:        BLOOD CULTURE RECEIVED NO GROWTH TO DATE CULTURE WILL BE HELD FOR 5 DAYS BEFORE ISSUING A FINAL NEGATIVE REPORT   Report Status PENDING   Incomplete  CULTURE, BLOOD (ROUTINE X 2)     Status: None   Collection Time    04/29/13  4:28 AM      Result Value Range Status   Specimen Description BLOOD RIGHT HAND   Final   Special Requests BOTTLES DRAWN AEROBIC AND ANAEROBIC 3CC   Final   Culture  Setup Time 04/29/2013 10:32   Final   Culture     Final   Value:         BLOOD CULTURE RECEIVED NO GROWTH TO DATE CULTURE WILL BE HELD FOR 5 DAYS BEFORE ISSUING A FINAL NEGATIVE REPORT   Report Status PENDING   Incomplete     Labs: Basic Metabolic Panel:  Recent Labs Lab 04/29/13 0408 04/30/13 0505 05/01/13 0535  NA 134* 134* 137  K 4.1 3.8 4.2  CL 98 102 102  CO2 26 27 27   GLUCOSE 137* 114* 121*  BUN 16 16 14   CREATININE 1.14* 1.06 0.97  CALCIUM 10.0 9.2 9.7   Liver Function Tests:  Recent Labs Lab 04/29/13 0408  AST 29  ALT 16  ALKPHOS 94  BILITOT 0.4  PROT 7.9  ALBUMIN 3.5   No results found for this basename: LIPASE, AMYLASE,  in the last 168 hours No results found for this basename: AMMONIA,  in the last 168 hours CBC:  Recent Labs Lab 04/29/13 0408 05/01/13 0535  WBC 8.9 6.1  NEUTROABS 7.1  --   HGB 11.4* 11.4*  HCT 33.8* 34.4*  MCV 90.4 92.0  PLT 207 203   Cardiac Enzymes: No results found for this basename: CKTOTAL, CKMB, CKMBINDEX, TROPONINI,  in the last 168 hours BNP: BNP (last 3 results)  Recent Labs  04/29/13 0408  PROBNP 87.8   CBG:  Recent Labs Lab 04/30/13 1158 04/30/13 1657 04/30/13 2131 05/01/13 0746 05/01/13 1151  GLUCAP 165* 99 131* 103* 146*       Signed:  HERNANDEZ ACOSTA,ESTELA  [facility] Pager: [phone] 05/01/2013, 2:12 PM

## 2013-05-01 NOTE — Evaluation (Signed)
Physical Therapy Evaluation Patient Details Name: Katrina Burton MRN: [id number] DOB: 10/08/1936 Today's Date: 05/01/2013 Time: 4098-1191 PT Time Calculation (min): 35 min  PT Assessment / Plan / Recommendation Clinical Impression  77 y/o BF admitted to hospital after having fever and AMS at her ALF.  Pt moving at MIN/guard level and states that staff at ALF are very helpful and will be able to assist her.  Recommend HHPT and RW upon d/c back to ALF.  Will follow acutely to work towards increasing her level of independence.    PT Assessment  Patient needs continued PT services    Follow Up Recommendations  Home health PT    Does the patient have the potential to tolerate intense rehabilitation      Barriers to Discharge        Equipment Recommendations  Rolling walker with 5" wheels    Recommendations for Other Services     Frequency Min 3X/week    Precautions / Restrictions Precautions Precautions: Fall Restrictions Weight Bearing Restrictions: No   Pertinent Vitals/Pain No c/o pain      Mobility  Bed Mobility Bed Mobility: Supine to Sit Supine to Sit: 5: Supervision;HOB elevated Transfers Sit to Stand: 4: Min guard;From bed Stand to Sit: 4: Min guard;To chair/3-in-1 Details for Transfer Assistance: cues to push up from bed Ambulation/Gait Ambulation/Gait Assistance: 4: Min guard Ambulation Distance (Feet): 120 Feet Assistive device: Rolling walker Ambulation/Gait Assistance Details: cues to stay within RW Gait velocity: cues to slow down    Exercises     PT Diagnosis: Difficulty walking  PT Problem List: Decreased mobility;Decreased knowledge of use of DME PT Treatment Interventions: Gait training;DME instruction;Therapeutic activities;Therapeutic exercise;Balance training   PT Goals Acute Rehab PT Goals PT Goal Formulation: With patient Time For Goal Achievement: 05/08/13 Potential to Achieve Goals: Good Pt will go Supine/Side to Sit: with modified  independence PT Goal: Supine/Side to Sit - Progress: Goal set today Pt will Transfer Bed to Chair/Chair to Bed: with modified independence PT Transfer Goal: Bed to Chair/Chair to Bed - Progress: Goal set today Pt will Ambulate: >150 feet;with supervision;with rolling walker PT Goal: Ambulate - Progress: Goal set today  Visit Information  Last PT Received On: 05/01/13 Assistance Needed: +1    Subjective Data  Subjective: "I used to have a walker.  I would really like one again." Patient Stated Goal: Get a walker.   Prior Functioning  Home Living Lives With:  (at ALF) Available Help at Discharge: Other (Comment) (ALF) Type of Home: Assisted living Bathroom Shower/Tub: Walk-in shower Home Adaptive Equipment: Grab bars around toilet;Grab bars in shower;Built-in shower seat Prior Function Level of Independence: Needs assistance (was having falls, using rails) Needs Assistance: Meal Prep;Light Housekeeping Meal Prep: Total Light Housekeeping: Total Communication Communication: No difficulties    Cognition  Cognition Arousal/Alertness: Awake/alert Behavior During Therapy: WFL for tasks assessed/performed Overall Cognitive Status: No family/caregiver present to determine baseline cognitive functioning (has history of dementia. unsure if at baseline)    Extremity/Trunk Assessment Right Upper Extremity Assessment RUE ROM/Strength/Tone: [facility] for tasks assessed Left Upper Extremity Assessment LUE ROM/Strength/Tone: [facility] for tasks assessed Right Lower Extremity Assessment RLE ROM/Strength/Tone: [facility] for tasks assessed Left Lower Extremity Assessment LLE ROM/Strength/Tone: WFL for tasks assessed   Balance Balance Balance Assessed: Yes Dynamic Standing Balance Dynamic Standing - Level of Assistance: 5: Stand by assistance  End of Session PT - End of Session Equipment Utilized During Treatment: Gait belt Activity Tolerance: Patient tolerated treatment well Patient left: in  chair;with  chair alarm set;with call bell/phone within reach Nurse Communication: Mobility status  GP     Katrina Burton LUBECK 05/01/2013, 12:20 PM

## 2013-05-01 NOTE — Progress Notes (Signed)
Clinical Social Work Department BRIEF PSYCHOSOCIAL ASSESSMENT 05/01/2013  Patient:  Katrina Burton, Katrina Burton     Account Number:  [id number]     Admit date:  04/29/2013  Clinical Social Worker:  Dennison Bulla  Date/Time:  05/01/2013 04:00 PM  Referred by:  Physician  Date Referred:  05/01/2013 Referred for  ALF Placement   Other Referral:   Interview type:  Patient Other interview type:    PSYCHOSOCIAL DATA Living Status:  FACILITY Admitted from facility:  [facility] Level of care:  Assisted Living Primary support name:  [place] Primary support relationship to patient:  CHILD, ADULT Degree of support available:   Strong    CURRENT CONCERNS Current Concerns  Post-Acute Placement   Other Concerns:    SOCIAL WORK ASSESSMENT / PLAN CSW received referral to assist with patient returning to ALF. CSW reviewed chart and met with patient.    Patient is from [facility]. Patient would like to return to that facility. CSW spoke with PT who recommends HH and RW. ALF agreeable to provide supervision and to order equipment and HH. CSW faxed DC summary and FL2 to ALF who is agreeable to admission.    CSW informed patient and RN of DC and both parties agreeable. CSW called dtr and left a message. CSW prepared DC packet with Fl2 and hard scripts included. CSW coordinated transportation via [place]. (Service # [phone]). CSW is signing off but available if needed.   Assessment/plan status:  No Further Intervention Required Other assessment/ plan:   Information/referral to community resources:   Will return to ALF    PATIENT'S/FAMILY'S RESPONSE TO PLAN OF CARE: Patient alert and oriented. Patient engaged and asked for dtr to be informed of plans.

## 2013-05-01 NOTE — Evaluation (Signed)
Occupational Therapy Evaluation Patient Details Name: Katrina Burton MRN: [id number] DOB: 11-Oct-1936 Today's Date: 05/01/2013 Time: 3086-5784 OT Time Calculation (min): 17 min  OT Assessment / Plan / Recommendation Clinical Impression  resident of [facility] with history of hypertension, diabetes and dementia brought to ED per EMS with complaints of fever and altered mental status. Pt overall is at min guard assist level with ADL and feel she can return to ALF if they can provide initial increased assist.     OT Assessment  Patient needs continued OT Services    Follow Up Recommendations  Home health OT;Supervision/Assistance - 24 hour    Barriers to Discharge      Equipment Recommendations  None recommended by OT    Recommendations for Other Services    Frequency  Min 2X/week    Precautions / Restrictions Precautions Precautions: Fall Restrictions Weight Bearing Restrictions: No        ADL  Eating/Feeding: Simulated;Independent Where Assessed - Eating/Feeding: Chair Grooming: Performed;Min guard Where Assessed - Grooming: Unsupported standing Upper Body Bathing: Simulated;Chest;Right arm;Left arm;Abdomen;Set up;Supervision/safety Where Assessed - Upper Body Bathing: Unsupported sitting Lower Body Bathing: Simulated;Min guard Where Assessed - Lower Body Bathing: Supported sit to stand Upper Body Dressing: Simulated;Set up;Supervision/safety Where Assessed - Upper Body Dressing: Unsupported sitting Lower Body Dressing: Simulated;Min guard Where Assessed - Lower Body Dressing: Supported sit to stand Toilet Transfer: Performed;Min guard Statistician Method: Other (comment) (with walker into bathroom) Toilet Transfer Equipment: Comfort height toilet;Grab bars Toileting - Clothing Manipulation and Hygiene: Simulated;Min guard Where Assessed - Engineer, mining and Hygiene: Sit to stand from 3-in-1 or toilet Equipment Used: Rolling  walker ADL Comments: Pt needs occassional verbal cues to stay closer to RW. Called social worker to determine how much assist is available at ALF. Pt currently at min guard assist level overall and feel if ALF can provide increase initial supervision/PRN assist, she can return there. Social worker states this help is available.     OT Diagnosis: Generalized weakness  OT Problem List: Decreased strength OT Treatment Interventions: Self-care/ADL training;Therapeutic activities;DME and/or AE instruction;Patient/family education   OT Goals Acute Rehab OT Goals OT Goal Formulation: With patient ADL Goals Pt Will Perform Grooming: with supervision;Standing at sink ADL Goal: Grooming - Progress: Goal set today Pt Will Transfer to Toilet: with supervision;Ambulation;Comfort height toilet;Grab bars ADL Goal: Toilet Transfer - Progress: Goal set today Pt Will Perform Toileting - Clothing Manipulation: with supervision;Standing ADL Goal: Toileting - Clothing Manipulation - Progress: Goal set today Additional ADL Goal #1: Pt will gather all B/D items and perform sponge bath and dress at supervision level with RW PRN. ADL Goal: Additional Goal #1 - Progress: Goal set today  Visit Information  Last OT Received On: 05/01/13 Assistance Needed: +1 PT/OT Co-Evaluation/Treatment: Yes    Subjective Data  Subjective: they have people that help there (at ALF) Patient Stated Goal: agreeable to work with therapy (none stated)   Prior Functioning     Home Living Lives With:  (at ALF) Type of Home: Assisted living Bathroom Shower/Tub: Walk-in shower Home Adaptive Equipment: Grab bars around toilet;Grab bars in shower;Built-in shower seat Prior Function Level of Independence: Needs assistance (was having falls, using rails) Needs Assistance: Meal Prep;Light Housekeeping Meal Prep: Total Light Housekeeping: Total Communication Communication: No difficulties         Vision/Perception      Cognition  Cognition Arousal/Alertness: Awake/alert Behavior During Therapy: WFL for tasks assessed/performed Overall Cognitive Status: No family/caregiver present to determine  baseline cognitive functioning (has history of dementia. unsure if at baseline)    Extremity/Trunk Assessment Right Upper Extremity Assessment RUE ROM/Strength/Tone: [facility] for tasks assessed Left Upper Extremity Assessment LUE ROM/Strength/Tone: WFL for tasks assessed     Mobility Transfers Transfers: Sit to Stand;Stand to Sit Sit to Stand: 4: Min guard;From toilet;With upper extremity assist Stand to Sit: 4: Min guard;To chair/3-in-1;To toilet;With upper extremity assist Details for Transfer Assistance: verbal cues for hand placement     Exercise     Balance Balance Balance Assessed: Yes Dynamic Standing Balance Dynamic Standing - Level of Assistance: 5: Stand by assistance   End of Session OT - End of Session Activity Tolerance: Patient tolerated treatment well Patient left: in chair;with call bell/phone within reach;with chair alarm set  GO     Katrina Burton [phone] 05/01/2013, 12:03 PM

## 2013-05-05 LAB — CULTURE, BLOOD (ROUTINE X 2)
Culture: NO GROWTH
Culture: NO GROWTH

## 2013-08-14 ENCOUNTER — Other Ambulatory Visit: Payer: Self-pay | Admitting: Gastroenterology

## 2013-08-14 DIAGNOSIS — R1310 Dysphagia, unspecified: Secondary | ICD-10-CM

## 2013-08-15 ENCOUNTER — Other Ambulatory Visit: Payer: Medicare Other

## 2013-08-21 ENCOUNTER — Ambulatory Visit
Admission: RE | Admit: 2013-08-21 | Discharge: 2013-08-21 | Disposition: A | Discharge status: Home / Self Care | Payer: Medicare Other | Source: Ambulatory Visit | Attending: Gastroenterology | Admitting: Gastroenterology

## 2013-08-21 DIAGNOSIS — R1310 Dysphagia, unspecified: Secondary | ICD-10-CM

## 2013-12-12 ENCOUNTER — Emergency Department (HOSPITAL_COMMUNITY): Payer: Medicare Other

## 2013-12-12 ENCOUNTER — Inpatient Hospital Stay (HOSPITAL_COMMUNITY)
Admission: EM | Admit: 2013-12-12 | Discharge: 2013-12-15 | DRG: 638 | Disposition: A | Discharge status: Skilled Nursing Facility | Payer: Medicare Other | Attending: Internal Medicine | Admitting: Internal Medicine

## 2013-12-12 ENCOUNTER — Encounter (HOSPITAL_COMMUNITY): Payer: Self-pay | Admitting: Emergency Medicine

## 2013-12-12 DIAGNOSIS — R4182 Altered mental status, unspecified: Secondary | ICD-10-CM

## 2013-12-12 DIAGNOSIS — E1101 Type 2 diabetes mellitus with hyperosmolarity with coma: Secondary | ICD-10-CM

## 2013-12-12 DIAGNOSIS — I10 Essential (primary) hypertension: Secondary | ICD-10-CM

## 2013-12-12 DIAGNOSIS — E785 Hyperlipidemia, unspecified: Secondary | ICD-10-CM | POA: Diagnosis present

## 2013-12-12 DIAGNOSIS — N179 Acute kidney failure, unspecified: Secondary | ICD-10-CM

## 2013-12-12 DIAGNOSIS — E119 Type 2 diabetes mellitus without complications: Secondary | ICD-10-CM

## 2013-12-12 DIAGNOSIS — Z79899 Other long term (current) drug therapy: Secondary | ICD-10-CM

## 2013-12-12 DIAGNOSIS — E1110 Type 2 diabetes mellitus with ketoacidosis without coma: Secondary | ICD-10-CM

## 2013-12-12 DIAGNOSIS — E1310 Other specified diabetes mellitus with ketoacidosis without coma: Principal | ICD-10-CM | POA: Diagnosis present

## 2013-12-12 DIAGNOSIS — Z23 Encounter for immunization: Secondary | ICD-10-CM

## 2013-12-12 DIAGNOSIS — R739 Hyperglycemia, unspecified: Secondary | ICD-10-CM

## 2013-12-12 DIAGNOSIS — E871 Hypo-osmolality and hyponatremia: Secondary | ICD-10-CM

## 2013-12-12 DIAGNOSIS — F0390 Unspecified dementia without behavioral disturbance: Secondary | ICD-10-CM

## 2013-12-12 LAB — GLUCOSE, CAPILLARY
Glucose-Capillary: >600 mg/dL (ref 70–99)
Glucose-Capillary: 357 mg/dL — ABNORMAL HIGH (ref 70–99)

## 2013-12-12 LAB — BLOOD GAS, ARTERIAL
Acid-Base Excess: 3.9 mmol/L — ABNORMAL HIGH (ref 0.0–2.0)
Bicarbonate: 23.9 mEq/L (ref 20.0–24.0)
Drawn by: 40415
FIO2: 0.21 %
TCO2: 20.2 mmol/L (ref 0–100)
pCO2 arterial: 23.2 mmHg — ABNORMAL LOW (ref 35.0–45.0)
pH, Arterial: 7.616 (ref 7.350–7.450)
pO2, Arterial: 111 mmHg — ABNORMAL HIGH (ref 80.0–100.0)

## 2013-12-12 LAB — BASIC METABOLIC PANEL
BUN: 26 mg/dL — ABNORMAL HIGH (ref 6–23)
BUN: 21 mg/dL (ref 6–23)
CO2: 26 mEq/L (ref 19–32)
CO2: 23 mEq/L (ref 19–32)
Calcium: 10.3 mg/dL (ref 8.4–10.5)
Calcium: 10.4 mg/dL (ref 8.4–10.5)
Chloride: 94 mEq/L — ABNORMAL LOW (ref 96–112)
Creatinine, Ser: 1.29 mg/dL — ABNORMAL HIGH (ref 0.50–1.10)
GFR calc Af Amer: 46 mL/min — ABNORMAL LOW (ref >90)
Glucose, Bld: 643 mg/dL (ref 70–99)
Glucose, Bld: 195 mg/dL — ABNORMAL HIGH (ref 70–99)
Potassium: 4.5 mEq/L (ref 3.5–5.1)
Potassium: 3.4 mEq/L — ABNORMAL LOW (ref 3.5–5.1)
Sodium: 126 mEq/L — ABNORMAL LOW (ref 135–145)

## 2013-12-12 LAB — CG4 I-STAT (LACTIC ACID): Lactic Acid, Venous: 2.47 mmol/L — ABNORMAL HIGH (ref 0.5–2.2)

## 2013-12-12 LAB — CBC
HCT: 41.1 % (ref 36.0–46.0)
HCT: 41.5 % (ref 36.0–46.0)
Hemoglobin: 14.5 g/dL (ref 12.0–15.0)
Hemoglobin: 15 g/dL (ref 12.0–15.0)
MCH: 32 pg (ref 26.0–34.0)
MCH: 31.8 pg (ref 26.0–34.0)
MCV: 88.1 fL (ref 78.0–100.0)
RBC: 4.53 MIL/uL (ref 3.87–5.11)
RBC: 4.71 MIL/uL (ref 3.87–5.11)

## 2013-12-12 LAB — URINALYSIS, ROUTINE W REFLEX MICROSCOPIC
Bilirubin Urine: NEGATIVE
Glucose, UA: >1000 mg/dL — AB
Hgb urine dipstick: NEGATIVE
Specific Gravity, Urine: 1.023 (ref 1.005–1.030)

## 2013-12-12 LAB — MRSA PCR SCREENING: MRSA by PCR: NEGATIVE

## 2013-12-12 LAB — POCT I-STAT TROPONIN I: Troponin i, poc: 0 ng/mL (ref 0.00–0.08)

## 2013-12-12 LAB — KETONES, QUALITATIVE: Acetone, Bld: NEGATIVE

## 2013-12-12 MED ORDER — PANTOPRAZOLE SODIUM 40 MG PO TBEC
40.0000 mg | DELAYED_RELEASE_TABLET | Freq: Every day | ORAL | Status: DC
  Administered 2013-12-12 – 2013-12-15 (×4): 40 mg via ORAL
  Filled 2013-12-12 (×4): qty 1

## 2013-12-12 MED ORDER — DEXTROSE-NACL 5-0.45 % IV SOLN
INTRAVENOUS | Status: DC
  Administered 2013-12-13: 11:00:00 via INTRAVENOUS
  Administered 2013-12-13: 100 mL/h via INTRAVENOUS

## 2013-12-12 MED ORDER — DIVALPROEX SODIUM ER 250 MG PO TB24
250.0000 mg | ORAL_TABLET | Freq: Every day | ORAL | Status: DC
  Administered 2013-12-12 – 2013-12-14 (×3): 250 mg via ORAL
  Filled 2013-12-12 (×4): qty 1

## 2013-12-12 MED ORDER — SODIUM CHLORIDE 0.9 % IV SOLN
INTRAVENOUS | Status: DC
  Filled 2013-12-12: qty 1

## 2013-12-12 MED ORDER — SODIUM CHLORIDE 0.9 % IV SOLN
INTRAVENOUS | Status: DC
  Administered 2013-12-12: 100 mL/h via INTRAVENOUS
  Administered 2013-12-14: 06:00:00 via INTRAVENOUS

## 2013-12-12 MED ORDER — DEXTROSE 50 % IV SOLN: 25.0000 mL | INTRAVENOUS | Status: DC | PRN

## 2013-12-12 MED ORDER — ALUM & MAG HYDROXIDE-SIMETH 200-200-20 MG/5ML PO SUSP: 30.0000 mL | Freq: Four times a day (QID) | ORAL | Status: DC | PRN

## 2013-12-12 MED ORDER — SODIUM CHLORIDE 0.9 % IV BOLUS (SEPSIS)
1000.0000 mL | Freq: Once | INTRAVENOUS | Status: AC
  Administered 2013-12-12: 1000 mL via INTRAVENOUS

## 2013-12-12 MED ORDER — SODIUM CHLORIDE 0.9 % IV SOLN: INTRAVENOUS | Status: AC

## 2013-12-12 MED ORDER — AMLODIPINE BESYLATE 10 MG PO TABS
10.0000 mg | ORAL_TABLET | Freq: Every day | ORAL | Status: DC
  Administered 2013-12-13 – 2013-12-15 (×3): 10 mg via ORAL
  Filled 2013-12-12 (×3): qty 1

## 2013-12-12 MED ORDER — HYDRALAZINE HCL 20 MG/ML IJ SOLN
5.0000 mg | Freq: Four times a day (QID) | INTRAMUSCULAR | Status: DC | PRN
  Administered 2013-12-12: 5 mg via INTRAVENOUS
  Filled 2013-12-12: qty 1

## 2013-12-12 MED ORDER — OLANZAPINE 10 MG PO TBDP
10.0000 mg | ORAL_TABLET | Freq: Every day | ORAL | Status: DC
  Administered 2013-12-12 – 2013-12-14 (×3): 10 mg via ORAL
  Filled 2013-12-12 (×4): qty 1

## 2013-12-12 MED ORDER — INSULIN REGULAR HUMAN 100 UNIT/ML IJ SOLN
INTRAMUSCULAR | Status: DC
  Administered 2013-12-12 (×2): 4.6 [IU]/h via INTRAVENOUS
  Administered 2013-12-13: 0.6 [IU]/h via INTRAVENOUS
  Filled 2013-12-12: qty 1

## 2013-12-12 MED ORDER — HEPARIN SODIUM (PORCINE) 5000 UNIT/ML IJ SOLN
5000.0000 [IU] | Freq: Three times a day (TID) | INTRAMUSCULAR | Status: DC
  Administered 2013-12-12 – 2013-12-15 (×8): 5000 [IU] via SUBCUTANEOUS
  Filled 2013-12-12 (×11): qty 1

## 2013-12-12 MED ORDER — ONDANSETRON HCL 4 MG/2ML IJ SOLN: 4.0000 mg | Freq: Four times a day (QID) | INTRAMUSCULAR | Status: DC | PRN

## 2013-12-12 MED ORDER — INSULIN ASPART 100 UNIT/ML ~~LOC~~ SOLN: 10.0000 [IU] | Freq: Once | SUBCUTANEOUS | Status: DC

## 2013-12-12 MED ORDER — POTASSIUM CHLORIDE 10 MEQ/100ML IV SOLN
10.0000 meq | INTRAVENOUS | Status: AC
  Administered 2013-12-13 (×4): 10 meq via INTRAVENOUS
  Filled 2013-12-12 (×3): qty 100

## 2013-12-12 MED ORDER — LORAZEPAM 0.5 MG PO TABS
0.5000 mg | ORAL_TABLET | Freq: Every day | ORAL | Status: DC
  Administered 2013-12-12 – 2013-12-14 (×3): 0.5 mg via ORAL
  Filled 2013-12-12 (×3): qty 1

## 2013-12-12 NOTE — ED Notes (Signed)
IV attempt x 2 by this RN. 

## 2013-12-12 NOTE — ED Provider Notes (Signed)
CSN: [id number]     Arrival date & time 12/12/13  1537 History   First MD Initiated Contact with Patient 12/12/13 1546     Chief Complaint  Patient presents with  . Hyperglycemia   (Consider location/radiation/quality/duration/timing/severity/associated sxs/prior Treatment) Patient is a 77 y.o. female presenting with hyperglycemia. The history is provided by the patient.  Hyperglycemia Blood sugar level PTA:  >600 Severity:  Severe Onset quality:  Gradual Timing:  Constant Progression:  Worsening Chronicity:  New Diabetes status:  Controlled with oral medications Relieved by:  Nothing Ineffective treatments:  Diet Associated symptoms: abdominal pain, nausea, shortness of breath and vomiting   Associated symptoms: no fever     Past Medical History  Diagnosis Date  . Dementia   . Hypertension   . Hyperlipidemia   . Diabetes mellitus    History reviewed. No pertinent past surgical history. No family history on file. History  Substance Use Topics  . Smoking status: Never Smoker   . Smokeless tobacco: Never Used  . Alcohol Use: No   OB History   Grav Para Term Preterm Abortions TAB SAB Ect Mult Living                 Review of Systems  Unable to perform ROS: Dementia  Constitutional: Negative for fever.  Respiratory: Positive for shortness of breath.   Gastrointestinal: Positive for nausea, vomiting and abdominal pain.  All other systems reviewed and are negative.    Allergies  Lasix; Lipitor; and Soap  Home Medications   Current Outpatient Rx  Name  Route  Sig  Dispense  Refill  . acetaminophen (TYLENOL) 325 MG tablet   Oral   Take 650 mg by mouth every 4 (four) hours as needed. For headache or fever         . albuterol (PROVENTIL) (2.5 MG/3ML) 0.083% nebulizer solution   Nebulization   Take 2.5 mg by nebulization every 4 (four) hours as needed. For wheezing         . amLODipine (NORVASC) 10 MG tablet   Oral   Take 10 mg by mouth daily.          . ciprofloxacin (CIPRO) 250 MG tablet   Oral   Take 1 tablet (250 mg total) by mouth 2 (two) times daily. For 5 days.   10 tablet   0   . divalproex (DEPAKOTE ER) 250 MG 24 hr tablet   Oral   Take 250 mg by mouth at bedtime.         . hydrochlorothiazide (HYDRODIURIL) 25 MG tablet   Oral   Take 25 mg by mouth daily.         Marland Kitchen loperamide (IMODIUM) 2 MG capsule   Oral   Take 2 mg by mouth daily as needed. For diarrhea         . LORazepam (ATIVAN) 0.5 MG tablet   Oral   Take 2 tablets (1 mg total) by mouth at bedtime.   30 tablet   0   . OLANZapine (ZYPREXA) 10 MG tablet   Oral   Take 10 mg by mouth every morning.         Marland Kitchen omeprazole (PRILOSEC) 40 MG capsule   Oral   Take 40 mg by mouth daily before breakfast.         . spironolactone (ALDACTONE) 25 MG tablet   Oral   Take 25 mg by mouth daily.         . traMADol Janean Sark)  50 MG tablet   Oral   Take 1 tablet (50 mg total) by mouth 2 (two) times daily. Foot pain.   30 tablet   0   . traZODone (DESYREL) 150 MG tablet   Oral   Take 150 mg by mouth at bedtime.          BP 124/51  Pulse 95  Temp(Src) 97.9 F (36.6 C) (Oral)  Resp 16  SpO2 99% Physical Exam  Nursing note and vitals reviewed. Constitutional: She appears well-developed and well-nourished. No distress.  HENT:  Head: Normocephalic and atraumatic.  Eyes: EOM are normal. Pupils are equal, round, and reactive to light.  Neck: Normal range of motion. Neck supple.  Cardiovascular: Normal rate and regular rhythm.  Exam reveals no friction rub.   No murmur heard. Pulmonary/Chest: Effort normal and breath sounds normal. No respiratory distress. She has no wheezes. She has no rales.  Abdominal: Soft. She exhibits no distension. There is no tenderness. There is no rebound.  Musculoskeletal: Normal range of motion. She exhibits no edema.  Neurological: She is alert. No cranial nerve deficit. She exhibits normal muscle tone. Coordination  normal.  Skin: No rash noted. She is not diaphoretic.    ED Course  Procedures (including critical care time) Labs Review Labs Reviewed  GLUCOSE, CAPILLARY - Abnormal; Notable for the following:    Glucose-Capillary >600 (*)    All other components within normal limits  BASIC METABOLIC PANEL - Abnormal; Notable for the following:    Sodium 126 (*)    Chloride 82 (*)    Glucose, Bld 643 (*)    BUN 26 (*)    Creatinine, Ser 1.60 (*)    GFR calc non Af Amer 30 (*)    GFR calc Af Amer 35 (*)    All other components within normal limits  URINALYSIS, ROUTINE W REFLEX MICROSCOPIC - Abnormal; Notable for the following:    Glucose, UA >1000 (*)    Ketones, ur 15 (*)    All other components within normal limits  CG4 I-STAT (LACTIC ACID) - Abnormal; Notable for the following:    Lactic Acid, Venous 2.47 (*)    All other components within normal limits  CBC  URINE MICROSCOPIC-ADD ON  POCT I-STAT TROPONIN I   Imaging Review Dg Chest 2 View  12/12/2013   CLINICAL DATA:  Cough.  EXAM: CHEST  2 VIEW  COMPARISON:  04/29/2013  FINDINGS: Mild reticular opacity at the lung bases similar to the prior study likely scarring or minor subsegmental atelectasis. Lungs are otherwise clear.  Cardiac silhouette is normal in size mediastinum is normal in contour.  No pleural effusion or pneumothorax.  Bony thorax is intact.  IMPRESSION: No acute cardiopulmonary disease.   Electronically Signed   By: Amie Portland M.D.   On: 12/12/2013 16:43    EKG Interpretation    Date/Time:  Friday December 12 2013 16:12:50 EST Ventricular Rate:  93 PR Interval:  158 QRS Duration: 78 QT Interval:  362 QTC Calculation: 450 R Axis:   -2 Text Interpretation:  Normal sinus rhythm Possible Left atrial enlargement Inferior infarct , age undetermined Cannot rule out Anterior infarct , age undetermined Similar to prior EKG Confirmed by [facility]  MD, Joshlyn Beadle ([id number]) on 12/12/2013 5:42:14 PM            MDM   1. DKA  (diabetic ketoacidoses)   2. Hyperglycemia    79F presents from the nursing home with increasing confusion. Hx of dementia,  diabetes, HTN. She tells me she doesn't feel well due to her sugars and BP being elevated and is oriented to the place. Reports N/V, cough, SOB. She also reports some mild abdominal pain. Sugars here >600. Belly benign. Lungs with decreased air movement. Will complete labs, CXR, urine studies, EKG.  Labs show elevated lactate at 2.47, hyperglycemia at 643, bicarb of 26, but anion gap of 18. Insulin drip started. Admitted to stepdown. Of note, states diabetes in history, but not on any diabetes meds in her history. I looked back at her D/C summary from May of this year and found she was listed as a diabetic but not on any medications.   Dagmar Hait, MD 12/12/13 [phone]

## 2013-12-12 NOTE — ED Notes (Signed)
IV team at bedside attempting IV insertion

## 2013-12-12 NOTE — ED Notes (Signed)
IV attempt by L Mcclure and S West.

## 2013-12-12 NOTE — H&P (Signed)
[facility] History and Physical  Caleigh Rabelo AOZ:[id number] DOB: 26-Sep-1936 DOA: 12/12/2013  Referring physician:  PCP: Default, Provider, MD   Chief Complaint: Nausea/vomiting  HPI: Katrina Burton is a 77 y.o. female with a past medical history of dementia, diabetes mellitus, hypertension,  history of acute encephalopathy in setting of urinary tract infection who presents as a transfer from [facility]. She developed multiple episodes of nausea and vomiting, nonbloody of gastric content with associated generalized abdominal pain today. She was unable to hold by mouth down. Patient also reported having dizziness/lightheadedness, as well as polyuria. She states overall not feeling well today. Per medical records he has a history of diabetes mellitus however it appears that she had not been on diabetic medications at her facility. She reports, however, taking "diabetes pills" in the past, also stating that she wouldn't "do the shots."  Lab work in the emergency room revealed a glucose of 643, with BMP showing an anion gap of 18. She was administered IV fluids and IV insulin in the emergency department.                                                         Review of Systems:  Constitutional:  No weight loss, night sweats, Fevers, chills, fatigue.  HEENT:  No headaches, Difficulty swallowing,Tooth/dental problems,Sore throat,  No sneezing, itching, ear ache, nasal congestion, post nasal drip,  Cardio-vascular:  No chest pain, Orthopnea, PND, swelling in lower extremities, anasarca, palpitations  GI:  No heartburn, indigestion, diarrhea, change in bowel habits, loss of appetite  Resp:  No shortness of breath with exertion or at rest. No excess mucus, no coughing up of blood.No change in color of mucus.No wheezing.No chest wall deformity  Skin:  no rash or lesions.  GU:  no dysuria, change in color of urine. No flank pain.  Musculoskeletal:  No joint pain or  swelling. No decreased range of motion. No back pain.  Psych:  No change in mood or affect. No depression or anxiety. No memory loss.   Past Medical History  Diagnosis Date  . Dementia   . Hypertension   . Hyperlipidemia   . Diabetes mellitus    History reviewed. No pertinent past surgical history. Social History:  reports that she has never smoked. She has never used smokeless tobacco. She reports that she does not drink alcohol or use illicit drugs.  Allergies  Allergen Reactions  . Lasix [Furosemide]     hives  . Lipitor [Atorvastatin]     Hives   . Soap     Certain types of soap    No family history on file.   Prior to Admission medications   Medication Sig Start Date End Date Taking? Authorizing Provider  acetaminophen (TYLENOL) 325 MG tablet Take 650 mg by mouth every 4 (four) hours as needed. For headache or fever   Yes Historical Provider, MD  alum & mag hydroxide-simeth (MAALOX/MYLANTA) 200-200-20 MG/5ML suspension Take 30 mLs by mouth every 6 (six) hours as needed for indigestion or heartburn.   Yes Historical Provider, MD  amLODipine (NORVASC) 10 MG tablet Take 10 mg by mouth daily.   Yes Historical Provider, MD  bumetanide (BUMEX) 1 MG tablet Take 1 mg by mouth daily.   Yes Historical Provider, MD  divalproex (DEPAKOTE ER) 250 MG  24 hr tablet Take 250 mg by mouth at bedtime.   Yes Historical Provider, MD  hydrOXYzine (ATARAX/VISTARIL) 25 MG tablet Take 25 mg by mouth every 6 (six) hours as needed for itching.   Yes Historical Provider, MD  loperamide (IMODIUM) 2 MG capsule Take 2 mg by mouth daily as needed. For diarrhea   Yes Historical Provider, MD  LORazepam (ATIVAN) 0.5 MG tablet Take 0.5 mg by mouth at bedtime. 05/01/13  Yes Estela Isaiah Blakes, MD  magnesium hydroxide (MILK OF MAGNESIA) 400 MG/5ML suspension Take 30 mLs by mouth at bedtime as needed for mild constipation.   Yes Historical Provider, MD  OLANZapine zydis (ZYPREXA) 10 MG disintegrating tablet  Take 10 mg by mouth at bedtime.   Yes Historical Provider, MD  pantoprazole (PROTONIX) 40 MG tablet Take 40 mg by mouth daily.   Yes Historical Provider, MD  spironolactone (ALDACTONE) 25 MG tablet Take 25 mg by mouth daily.   Yes Historical Provider, MD  traMADol (ULTRAM) 50 MG tablet Take 50 mg by mouth. 3 times daily for foot pain. May also take every 4 hours AS NEEDED for pain. 05/01/13  Yes Henderson Cloud, MD  traZODone (DESYREL) 150 MG tablet Take 150 mg by mouth at bedtime.   Yes Historical Provider, MD   Physical Exam: Filed Vitals:   12/12/13 1550  BP: 124/51  Pulse: 95  Temp: 97.9 F (36.6 C)  Resp: 16    BP 124/51  Pulse 95  Temp(Src) 97.9 F (36.6 C) (Oral)  Resp 16  SpO2 99%  General:  Appears calm and comfortable Eyes: PERRL, normal lids, irises & conjunctiva ENT: grossly normal hearing, lips & tongue, has dry oral mucosa Neck: no LAD, masses or thyromegaly Cardiovascular: RRR, no m/r/g. No LE edema. Telemetry: SR, no arrhythmias  Respiratory: CTA bilaterally, no w/r/r. Normal respiratory effort. Abdomen: soft, ntnd Skin: no rash or induration seen on limited exam Musculoskeletal: grossly normal tone, has trace edema to lower extremities bilaterally. Psychiatric: grossly normal mood and affect, speech fluent and appropriate Neurologic: grossly non-focal.          Labs on Admission:  Basic Metabolic Panel:  Recent Labs Lab 12/12/13 1600  NA 126*  K 4.5  CL 82*  CO2 26  GLUCOSE 643*  BUN 26*  CREATININE 1.60*  CALCIUM 10.3   Liver Function Tests: No results found for this basename: AST, ALT, ALKPHOS, BILITOT, PROT, ALBUMIN,  in the last 168 hours No results found for this basename: LIPASE, AMYLASE,  in the last 168 hours No results found for this basename: AMMONIA,  in the last 168 hours CBC:  Recent Labs Lab 12/12/13 1600  WBC 7.4  HGB 14.5  HCT 41.1  MCV 90.7  PLT 253   Cardiac Enzymes: No results found for this basename:  CKTOTAL, CKMB, CKMBINDEX, TROPONINI,  in the last 168 hours  BNP (last 3 results)  Recent Labs  04/29/13 0408  PROBNP 87.8   CBG:  Recent Labs Lab 12/12/13 1547  GLUCAP >600*    Radiological Exams on Admission: Dg Chest 2 View  12/12/2013   CLINICAL DATA:  Cough.  EXAM: CHEST  2 VIEW  COMPARISON:  04/29/2013  FINDINGS: Mild reticular opacity at the lung bases similar to the prior study likely scarring or minor subsegmental atelectasis. Lungs are otherwise clear.  Cardiac silhouette is normal in size mediastinum is normal in contour.  No pleural effusion or pneumothorax.  Bony thorax is intact.  IMPRESSION: No  acute cardiopulmonary disease.   Electronically Signed   By: Amie Portland M.D.   On: 12/12/2013 16:43    EKG: Independently reviewed. EKG showing normal sinus rhythm   Assessment/Plan Active Problems:   DKA (diabetic ketoacidoses)   Hyponatremia   Acute kidney injury   Hypertension   Dementia   1. Suspected early diabetic ketoacidosis. Although initial BMP showed a bicarbonate of 26, urine was positive for ketones as she had an anion gap of 18. Blood sugars were elevated at 643. Will admit patient to the step down unit, started on the DKA protocol, administered IV fluids and IV insulin. Repeat BMP, follow blood sugar checks. Per medical record she has a history of diabetes mellitus however had not been on hypoglycemics at her facility. Initial labs not show an obvious source of infection as chest x-ray and urinalysis coming back unremarkable. Lab work showing a normal white count at 7400 as she is afebrile on presentation.  2. Acute kidney injury. Patient presented with an elevated creatinine 1.6 with BUN of 26. Looking back at medical records she had a creatinine of 0.97 with BUN of 14 on 05/01/2013. I suspect secondary to prerenal azotemia, having multiple episodes of nausea vomiting today. Provide IV fluid resuscitation, followup on a.m. basic metabolic  panel. 3. Hyponatremia. Likely secondary to hypotonic hypovolemic hyponatremia in setting of GI loss from multiple episodes of nausea and vomiting. Patient receiving IV fluid resuscitation. 4. Nausea vomiting. Could be secondary to infectious causes such as viral gastroenteritis. She had a benign abdominal examination in the emergency department as well as lab work showing a white count within normal range. Will provide supportive care, antiemetic therapy as needed.  5. Abdominal pain. As mentioned above this could be secondary to infectious gastroenteritis. Given benign abdominal examination, afebrile normal white count, will monitor overnight, consider abdominal imaging studies if there is a clinical change. 6. Hypertension. Continue count channel blocker 7. Nutrition.  N.p.o. 8. DVT prophylaxis. Heparin subcutaneous    Code Status: Full Code Disposition Plan: I anticipate patient will require greater than 2 night hospitalization  Time spent: 31  Jeralyn Bennett [facility] Pager [phone]

## 2013-12-12 NOTE — ED Notes (Addendum)
Pt oriented to self and aware she is at hospital. Pt has hx of dementia. Pt reports stomach pain, cough, and urinary frequency. Pt reports abdominal pain. Pt reports n/v/d.

## 2013-12-12 NOTE — ED Notes (Signed)
Bed: [facility] Expected date:  Expected time:  Means of arrival:  Comments: EMS-hyperglycemia 

## 2013-12-12 NOTE — Progress Notes (Signed)
Utilization Review completed.  Teofilo Lupinacci RN CM  

## 2013-12-12 NOTE — ED Notes (Signed)
Per EMS was called out by staff d/t increased confusion with hx of dementia, loss of appetite x1 week. Pt reports n/v. CBG >500 with EMS.

## 2013-12-13 LAB — CBC
Hemoglobin: 12.8 g/dL (ref 12.0–15.0)
MCHC: 35 g/dL (ref 30.0–36.0)
MCV: 90.4 fL (ref 78.0–100.0)
Platelets: 211 10*3/uL (ref 150–400)
RBC: 4.05 MIL/uL (ref 3.87–5.11)
WBC: 7.2 10*3/uL (ref 4.0–10.5)

## 2013-12-13 LAB — BLOOD GAS, ARTERIAL
FIO2: 0.21 %
O2 Saturation: 93.8 %
Patient temperature: 98.6
pH, Arterial: 7.453 — ABNORMAL HIGH (ref 7.350–7.450)

## 2013-12-13 LAB — GLUCOSE, CAPILLARY
Glucose-Capillary: 118 mg/dL — ABNORMAL HIGH (ref 70–99)
Glucose-Capillary: 126 mg/dL — ABNORMAL HIGH (ref 70–99)
Glucose-Capillary: 157 mg/dL — ABNORMAL HIGH (ref 70–99)
Glucose-Capillary: 174 mg/dL — ABNORMAL HIGH (ref 70–99)
Glucose-Capillary: 228 mg/dL — ABNORMAL HIGH (ref 70–99)
Glucose-Capillary: 198 mg/dL — ABNORMAL HIGH (ref 70–99)
Glucose-Capillary: 123 mg/dL — ABNORMAL HIGH (ref 70–99)
Glucose-Capillary: 159 mg/dL — ABNORMAL HIGH (ref 70–99)
Glucose-Capillary: 196 mg/dL — ABNORMAL HIGH (ref 70–99)
Glucose-Capillary: 127 mg/dL — ABNORMAL HIGH (ref 70–99)

## 2013-12-13 LAB — BASIC METABOLIC PANEL
BUN: 20 mg/dL (ref 6–23)
BUN: 19 mg/dL (ref 6–23)
BUN: 16 mg/dL (ref 6–23)
BUN: 15 mg/dL (ref 6–23)
CO2: 28 mEq/L (ref 19–32)
CO2: 24 mEq/L (ref 19–32)
CO2: 26 mEq/L (ref 19–32)
CO2: 26 mEq/L (ref 19–32)
CO2: 26 mEq/L (ref 19–32)
CO2: 22 mEq/L (ref 19–32)
Calcium: 9.6 mg/dL (ref 8.4–10.5)
Calcium: 9.1 mg/dL (ref 8.4–10.5)
Calcium: 9.5 mg/dL (ref 8.4–10.5)
Calcium: 9.3 mg/dL (ref 8.4–10.5)
Calcium: 9 mg/dL (ref 8.4–10.5)
Chloride: 98 mEq/L (ref 96–112)
Chloride: 96 mEq/L (ref 96–112)
Chloride: 96 mEq/L (ref 96–112)
Creatinine, Ser: 1.11 mg/dL — ABNORMAL HIGH (ref 0.50–1.10)
Creatinine, Ser: 1.13 mg/dL — ABNORMAL HIGH (ref 0.50–1.10)
Creatinine, Ser: 1.1 mg/dL (ref 0.50–1.10)
GFR calc Af Amer: 55 mL/min — ABNORMAL LOW (ref >90)
GFR calc Af Amer: 54 mL/min — ABNORMAL LOW (ref >90)
GFR calc non Af Amer: 40 mL/min — ABNORMAL LOW (ref >90)
GFR calc non Af Amer: 47 mL/min — ABNORMAL LOW (ref >90)
Glucose, Bld: 122 mg/dL — ABNORMAL HIGH (ref 70–99)
Glucose, Bld: 171 mg/dL — ABNORMAL HIGH (ref 70–99)
Glucose, Bld: 142 mg/dL — ABNORMAL HIGH (ref 70–99)
Glucose, Bld: 361 mg/dL — ABNORMAL HIGH (ref 70–99)
Potassium: 3.5 mEq/L (ref 3.5–5.1)
Potassium: 3 mEq/L — ABNORMAL LOW (ref 3.5–5.1)
Potassium: 3.5 mEq/L (ref 3.5–5.1)
Sodium: 137 mEq/L (ref 135–145)
Sodium: 133 mEq/L — ABNORMAL LOW (ref 135–145)
Sodium: 134 mEq/L — ABNORMAL LOW (ref 135–145)
Sodium: 133 mEq/L — ABNORMAL LOW (ref 135–145)
Sodium: 134 mEq/L — ABNORMAL LOW (ref 135–145)
Sodium: 133 mEq/L — ABNORMAL LOW (ref 135–145)
Sodium: 127 mEq/L — ABNORMAL LOW (ref 135–145)

## 2013-12-13 MED ORDER — PNEUMOCOCCAL VAC POLYVALENT 25 MCG/0.5ML IJ INJ
0.5000 mL | INJECTION | INTRAMUSCULAR | Status: AC
  Administered 2013-12-14: 0.5 mL via INTRAMUSCULAR
  Filled 2013-12-13 (×2): qty 0.5

## 2013-12-13 MED ORDER — INSULIN ASPART 100 UNIT/ML ~~LOC~~ SOLN
0.0000 [IU] | Freq: Three times a day (TID) | SUBCUTANEOUS | Status: DC
  Administered 2013-12-13: 15 [IU] via SUBCUTANEOUS
  Administered 2013-12-13: 3 [IU] via SUBCUTANEOUS
  Administered 2013-12-14: 11 [IU] via SUBCUTANEOUS
  Administered 2013-12-14: 15 [IU] via SUBCUTANEOUS
  Administered 2013-12-14: 5 [IU] via SUBCUTANEOUS
  Administered 2013-12-15 (×2): 3 [IU] via SUBCUTANEOUS

## 2013-12-13 MED ORDER — INSULIN ASPART 100 UNIT/ML ~~LOC~~ SOLN: 0.0000 [IU] | Freq: Every day | SUBCUTANEOUS | Status: DC

## 2013-12-13 MED ORDER — INFLUENZA VAC SPLIT QUAD 0.5 ML IM SUSP
0.5000 mL | INTRAMUSCULAR | Status: AC
  Administered 2013-12-14: 0.5 mL via INTRAMUSCULAR
  Filled 2013-12-13 (×2): qty 0.5

## 2013-12-13 MED ORDER — INSULIN ASPART 100 UNIT/ML ~~LOC~~ SOLN
5.0000 [IU] | Freq: Three times a day (TID) | SUBCUTANEOUS | Status: DC
  Administered 2013-12-13: 5 [IU] via SUBCUTANEOUS

## 2013-12-13 MED ORDER — INSULIN GLARGINE 100 UNIT/ML ~~LOC~~ SOLN
15.0000 [IU] | Freq: Every day | SUBCUTANEOUS | Status: DC
  Administered 2013-12-13: 15 [IU] via SUBCUTANEOUS
  Filled 2013-12-13 (×2): qty 0.15

## 2013-12-13 NOTE — Progress Notes (Signed)
[facility] PROGRESS NOTE  Shayna Eblen EAV:[id number] DOB: [birth year] DOA: 12/12/2013 PCP: Default, Provider, MD  Assessment/Plan: 1. Hyperosmolar hyperglycemic state. Patient admitted to the step down unit placed on IV insulin, IV fluids, with serial BMPs obtained overnight. Her blood sugars have come down this morning, we'll continue following protocol. Per medical records patient having history of diabetes mellitus however had not been on hypoglycemics. Plan to transition her to basal insulin today with the discontinuation of her insulin drip, and started her on a consistent diet. 2. Acute kidney injury. Patient presented with an elevated creatinine of 1.6 with BUN of 26, improving on this mornings lab work with creatinine 1.24 and BUN of 18. In part I believe this is secondary to prerenal azotemia patient multiple episodes of nausea vomiting yesterday. 3. Hyponatremia. Patient presenting with a sodium of 126, improved to 134 this mornings lab work. Likely secondary to hypotonic hypovolemic hyponatremia, resulting from multiple episodes of nausea and vomiting.  4. Nausea/vomiting. This could be secondary to infectious viral gastroenteritis. Continue supportive care, as needed antiemetic therapy. She seems improved this morning plan to advance diet. 5. Hypertension. Blood pressure stable this morning with last blood pressure reading of 120/68. Continue calcium channel blocker 6. DVT prophylaxis. Heparin subcutaneous  Code Status: Full Code Disposition Plan: Will continue following insulin protocol, with plans to transition to basal insulin today.   HPI/Subjective: Patient is a pleasant 77 year old female, with a history of dementia, nursing home resident, who was admitted to the medicine service on 12/12/2013 presenting with blood sugars in the 600 range. Initially have an anion gap of 18, started on IV insulin and IV fluids admitted to the step down unit. There were no overnight  events, serial BMPs were followed, as her anion gap has closed. This morning she reports feeling better, has not had further episodes of nausea or vomiting. Patient is hoping to eat this morning.  Objective: Filed Vitals:   12/13/13 0800  BP: 120/68  Pulse: 68  Temp: 98.3 F (36.8 C)  Resp: 18    Intake/Output Summary (Last 24 hours) at 12/13/13 0848 Last data filed at 12/13/13 7829  Gross per 24 hour  Intake 1179.35 ml  Output     15 ml  Net 1164.35 ml   There were no vitals filed for this visit.  Exam:   General:  Patient states feeling better this morning, hoping to have breakfast  Cardiovascular: Regular rate and rhythm normal S1-S2  Respiratory: Clear to auscultation bilaterally  Abdomen: Having benign abdominal examination today  Musculoskeletal: No edema   Data Reviewed: Basic Metabolic Panel:  Recent Labs Lab 12/12/13 1600 12/12/13 2222 12/13/13 0030 12/13/13 0217 12/13/13 0414  NA 126* 136 137 133* 134*  K 4.5 3.4* 3.2* 3.5 3.0*  CL 82* 94* 98 97 96  CO2 26 23 28 24 26   GLUCOSE 643* 195* 122* 171* 239*  BUN 26* 21 20 19 18   CREATININE 1.60* 1.29* 1.27* 1.22* 1.24*  CALCIUM 10.3 10.4 9.6 9.4 9.1   Liver Function Tests: No results found for this basename: AST, ALT, ALKPHOS, BILITOT, PROT, ALBUMIN,  in the last 168 hours No results found for this basename: LIPASE, AMYLASE,  in the last 168 hours No results found for this basename: AMMONIA,  in the last 168 hours CBC:  Recent Labs Lab 12/12/13 1600 12/12/13 2222 12/13/13 0414  WBC 7.4 8.4 7.2  HGB 14.5 15.0 12.8  HCT 41.1 41.5 36.6  MCV 90.7 88.1 90.4  PLT 253  254 211   Cardiac Enzymes: No results found for this basename: CKTOTAL, CKMB, CKMBINDEX, TROPONINI,  in the last 168 hours BNP (last 3 results)  Recent Labs  04/29/13 0408  PROBNP 87.8   CBG:  Recent Labs Lab 12/13/13 0418 12/13/13 0524 12/13/13 0615 12/13/13 0721 12/13/13 0826  GLUCAP 228* 198* 168* 132* 107*     Recent Results (from the past 240 hour(s))  MRSA PCR SCREENING     Status: None   Collection Time    12/12/13  8:12 PM      Result Value Range Status   MRSA by PCR NEGATIVE  NEGATIVE Final   Comment:            The GeneXpert MRSA Assay (FDA     approved for NASAL specimens     only), is one component of a     comprehensive MRSA colonization     surveillance program. It is not     intended to diagnose MRSA     infection nor to guide or     monitor treatment for     MRSA infections.     Studies: Dg Chest 2 View  12/12/2013   CLINICAL DATA:  Cough.  EXAM: CHEST  2 VIEW  COMPARISON:  04/29/2013  FINDINGS: Mild reticular opacity at the lung bases similar to the prior study likely scarring or minor subsegmental atelectasis. Lungs are otherwise clear.  Cardiac silhouette is normal in size mediastinum is normal in contour.  No pleural effusion or pneumothorax.  Bony thorax is intact.  IMPRESSION: No acute cardiopulmonary disease.   Electronically Signed   By: Amie Portland M.D.   On: 12/12/2013 16:43    Scheduled Meds: . amLODipine  10 mg Oral Daily  . divalproex  250 mg Oral QHS  . heparin  5,000 Units Subcutaneous Q8H  . LORazepam  0.5 mg Oral QHS  . OLANZapine zydis  10 mg Oral QHS  . pantoprazole  40 mg Oral Daily  . potassium chloride  10 mEq Intravenous Q1H   Continuous Infusions: . sodium chloride 100 mL/hr (12/12/13 2103)  . dextrose 5 % and 0.45% NaCl 100 mL/hr (12/13/13 0028)  . insulin (NOVOLIN-R) infusion 3.4 Units/hr (12/13/13 0415)  . insulin (NOVOLIN-R) infusion 0.5 Units/hr (12/13/13 1610)    Active Problems:   DKA (diabetic ketoacidoses)   Hyponatremia   Acute kidney injury   Hypertension   Dementia    Time spent: 35 minutes    Jeralyn Bennett  [facility] Pager [phone]. If 7PM-7AM, please contact night-coverage at [email or web address], password [facility] 12/13/2013, 8:48 AM  LOS: 1 day

## 2013-12-14 DIAGNOSIS — R7309 Other abnormal glucose: Secondary | ICD-10-CM

## 2013-12-14 DIAGNOSIS — E1101 Type 2 diabetes mellitus with hyperosmolarity with coma: Secondary | ICD-10-CM

## 2013-12-14 LAB — BASIC METABOLIC PANEL
BUN: 11 mg/dL (ref 6–23)
Calcium: 9.3 mg/dL (ref 8.4–10.5)
Chloride: 103 mEq/L (ref 96–112)
Creatinine, Ser: 1.02 mg/dL (ref 0.50–1.10)
GFR calc Af Amer: 61 mL/min — ABNORMAL LOW (ref >90)
GFR calc non Af Amer: 52 mL/min — ABNORMAL LOW (ref >90)
Glucose, Bld: 342 mg/dL — ABNORMAL HIGH (ref 70–99)
Potassium: 3.5 mEq/L (ref 3.5–5.1)

## 2013-12-14 LAB — GLUCOSE, CAPILLARY
Glucose-Capillary: 290 mg/dL — ABNORMAL HIGH (ref 70–99)
Glucose-Capillary: 392 mg/dL — ABNORMAL HIGH (ref 70–99)

## 2013-12-14 LAB — CBC
Hemoglobin: 12.6 g/dL (ref 12.0–15.0)
MCH: 31.6 pg (ref 26.0–34.0)
MCV: 90.7 fL (ref 78.0–100.0)
RBC: 3.99 MIL/uL (ref 3.87–5.11)
WBC: 6 10*3/uL (ref 4.0–10.5)

## 2013-12-14 LAB — HEMOGLOBIN A1C: Mean Plasma Glucose: 424 mg/dL — ABNORMAL HIGH (ref <117)

## 2013-12-14 MED ORDER — INSULIN GLARGINE 100 UNIT/ML ~~LOC~~ SOLN
25.0000 [IU] | Freq: Every day | SUBCUTANEOUS | Status: DC
  Administered 2013-12-14 – 2013-12-15 (×2): 25 [IU] via SUBCUTANEOUS
  Filled 2013-12-14 (×2): qty 0.25

## 2013-12-14 MED ORDER — METFORMIN HCL 500 MG PO TABS
500.0000 mg | ORAL_TABLET | Freq: Two times a day (BID) | ORAL | Status: DC
  Administered 2013-12-14 – 2013-12-15 (×3): 500 mg via ORAL
  Filled 2013-12-14 (×5): qty 1

## 2013-12-14 NOTE — Evaluation (Signed)
Physical Therapy Evaluation Patient Details Name: Katrina Burton MRN: [id number] DOB: 14-Oct-1936 Today's Date: 12/14/2013 Time: 4098-1191 PT Time Calculation (min): 26 min  PT Assessment / Plan / Recommendation History of Present Illness  Katrina Burton is a 77 y.o. female with a past medical history of dementia, diabetes mellitus, hypertension,  history of acute encephalopathy in setting of urinary tract infection who presents as a transfer from [facility]. She developed multiple episodes of nausea and vomiting, nonbloody of gastric content with associated generalized abdominal pain today. She was unable to hold by mouth down. Patient also reported having dizziness/lightheadedness, as well as polyuria. She states overall not feeling well today.   Clinical Impression  Pt will benefit from PT to address deficits below; She is unable to  give much info (baseline dementia) at time of eval , no family present; Chart states pt came from a NH facility, will follow and recommend SNF at this time     PT Assessment  Patient needs continued PT services    Follow Up Recommendations  SNF (?pt was at facility prior to adm)    Does the patient have the potential to tolerate intense rehabilitation      Barriers to Discharge        Equipment Recommendations  Rolling walker with 5" wheels    Recommendations for Other Services     Frequency Min 3X/week    Precautions / Restrictions Precautions Precautions: Fall   Pertinent Vitals/Pain      Mobility  Bed Mobility Bed Mobility: Supine to Sit;Sit to Supine Supine to Sit: 4: Min guard Sit to Supine: 4: Min assist Details for Bed Mobility Assistance: cues for task completion Transfers Transfers: Sit to Stand;Stand to Sit Sit to Stand: 4: Min guard;4: Min assist Stand to Sit: 4: Min assist;4: Min guard Details for Transfer Assistance: for safety and intial balance  Ambulation/Gait Ambulation/Gait Assistance: 4: Min  assist;4: Min [employer] social research officer (Feet): 125 Feet Assistive device: 2 person hand held assist Ambulation/Gait Assistance Details: min/minguard for balance Gait Pattern: Decreased stride length General Gait Details: pt stability improved wtih distance    Exercises     PT Diagnosis: Difficulty walking  PT Problem List: Decreased balance;Decreased mobility;Decreased safety awareness PT Treatment Interventions: DME instruction;Gait training;Functional mobility training;Therapeutic activities;Therapeutic exercise     PT Goals(Current goals can be found in the care plan section) Acute Rehab PT Goals Patient Stated Goal: i want on eof those (a walker) PT Goal Formulation: Patient unable to participate in goal setting Time For Goal Achievement: 12/21/13 Potential to Achieve Goals: Good  Visit Information  Last PT Received On: 12/14/13 Assistance Needed: +1 History of Present Illness: Katrina Burton is a 77 y.o. female with a past medical history of dementia, diabetes mellitus, hypertension,  history of acute encephalopathy in setting of urinary tract infection who presents as a transfer from [place] [facility]. She developed multiple episodes of nausea and vomiting, nonbloody of gastric content with associated generalized abdominal pain today. She was unable to hold by mouth down. Patient also reported having dizziness/lightheadedness, as well as polyuria. She states overall not feeling well today.        Prior Functioning  Home Living Family/patient expects to be discharged to:: Skilled nursing facility Additional Comments: chart states pt is a "NH resident" Prior Function Comments: pt unable to provide accurate info; she follows most fucntional commands consistently but verblaizes very little Communication Communication: No difficulties    Cognition  Cognition Arousal/Alertness: Awake/alert Behavior During  Therapy: Flat affect Overall Cognitive Status: History of  cognitive impairments - at baseline    Extremity/Trunk Assessment Upper Extremity Assessment Upper Extremity Assessment: Overall WFL for tasks assessed Lower Extremity Assessment Lower Extremity Assessment: Overall WFL for tasks assessed   Balance    End of Session PT - End of Session Activity Tolerance: Patient tolerated treatment well Patient left: in bed;with call bell/phone within reach;with bed alarm set  GP     [facility] 12/14/2013, 1:14 PM

## 2013-12-14 NOTE — Progress Notes (Signed)
[facility] PROGRESS NOTE  Katrina Burton ZOX:[id number] DOB: [birth year] DOA: 12/12/2013 PCP: Default, Provider, MD  Assessment/Plan: 1. Hyperosmolar hyperglycemic state. Patient transitioned to Lantus 15 units subcutaneous q. daily with sliding scale coverage. Her blood sugars remain elevated at 200 300 range. I will proceed with increasing her Lantus to 25 units subcutaneous daily. Add metformin 500 mg by mouth twice a day. Continue Accu-Cheks q. a.c. and each bedtime with sliding scale coverage. 2. Acute kidney injury. Patient presented with an elevated creatinine of 1.6 with BUN of 26, improving on 12/13/13  with creatinine 1.24 and BUN of 18. In part I believe this is secondary to prerenal azotemia patient multiple episodes of nausea vomiting a.m. lab work showing a creatinine of 1.01 with BUN of 11. Will discontinue IV fluids.. 3. Hyponatremia. Patient presenting with a sodium of 126, improved to 134 on 12/13/2013. Likely secondary to hypotonic hypovolemic hyponatremia, resulting from multiple episodes of nausea and vomiting.  4. Nausea/vomiting. This could be secondary to infectious viral gastroenteritis. She has not had further episodes of nausea vomiting during this hospitalization.. 5. Hypertension. Blood pressure stable this morning with last blood pressure reading of 120/68. Continue calcium channel blocker 6. DVT prophylaxis. Heparin subcutaneous  Code Status: Full Code Disposition Plan: Will continue following insulin protocol, with plans to transition to basal insulin today.   HPI/Subjective: Patient is a pleasant 77 year old female, with a history of dementia, nursing home resident, who was admitted to the medicine service on 12/12/2013 presenting with blood sugars in the 600 range. Initially have an anion gap of 18, started on IV insulin and IV fluids admitted to the step down unit. There were no overnight events.  On 12/13/2013 she was transferred to telemetry, today she  appears to do better, we ambulated down the hallway and back, although her blood sugars remain elevated at 200-300 range. I am increasing her Lantus to 25 units subcutaneous daily and adding metformin 500 mg by mouth twice a day. Patient tolerating by mouth intake  Objective: Filed Vitals:   12/14/13 1055  BP: 103/47  Pulse: 69  Temp:   Resp:     Intake/Output Summary (Last 24 hours) at 12/14/13 1213 Last data filed at 12/13/13 1700  Gross per 24 hour  Intake    840 ml  Output    450 ml  Net    390 ml   Filed Weights   12/13/13 1740 12/13/13 2019  Weight: 84.777 kg (186 lb 14.4 oz) 85.004 kg (187 lb 6.4 oz)    Exam:   General:  Patient states feeling better this morning, hoping to have breakfast  Cardiovascular: Regular rate and rhythm normal S1-S2  Respiratory: Clear to auscultation bilaterally  Abdomen: Having benign abdominal examination today  Musculoskeletal: No edema   Data Reviewed: Basic Metabolic Panel:  Recent Labs Lab 12/13/13 0750 12/13/13 0950 12/13/13 1220 12/13/13 1445 12/14/13 0445  NA 133* 134* 133* 127* 133*  K 3.6 4.0 4.1 3.5 3.5  CL 96 99 96 93* 103  CO2 28 26 26 22 20   GLUCOSE 121* 142* 216* 361* 342*  BUN 16 16 15 13 11   CREATININE 1.11* 1.13* 1.10 1.00 1.02  CALCIUM 9.5 9.3 9.3 9.0 9.3   Liver Function Tests: No results found for this basename: AST, ALT, ALKPHOS, BILITOT, PROT, ALBUMIN,  in the last 168 hours No results found for this basename: LIPASE, AMYLASE,  in the last 168 hours No results found for this basename: AMMONIA,  in the last 168  hours CBC:  Recent Labs Lab 12/12/13 1600 12/12/13 2222 12/13/13 0414 12/14/13 0445  WBC 7.4 8.4 7.2 6.0  HGB 14.5 15.0 12.8 12.6  HCT 41.1 41.5 36.6 36.2  MCV 90.7 88.1 90.4 90.7  PLT 253 254 211 195   Cardiac Enzymes: No results found for this basename: CKTOTAL, CKMB, CKMBINDEX, TROPONINI,  in the last 168 hours BNP (last 3 results)  Recent Labs  04/29/13 0408  PROBNP 87.8    CBG:  Recent Labs Lab 12/13/13 1621 12/13/13 2147 12/14/13 0531 12/14/13 0747 12/14/13 1133  GLUCAP 367* 127* 290* 315* 392*    Recent Results (from the past 240 hour(s))  MRSA PCR SCREENING     Status: None   Collection Time    12/12/13  8:12 PM      Result Value Range Status   MRSA by PCR NEGATIVE  NEGATIVE Final   Comment:            The GeneXpert MRSA Assay (FDA     approved for NASAL specimens     only), is one component of a     comprehensive MRSA colonization     surveillance program. It is not     intended to diagnose MRSA     infection nor to guide or     monitor treatment for     MRSA infections.     Studies: Dg Chest 2 View  12/12/2013   CLINICAL DATA:  Cough.  EXAM: CHEST  2 VIEW  COMPARISON:  04/29/2013  FINDINGS: Mild reticular opacity at the lung bases similar to the prior study likely scarring or minor subsegmental atelectasis. Lungs are otherwise clear.  Cardiac silhouette is normal in size mediastinum is normal in contour.  No pleural effusion or pneumothorax.  Bony thorax is intact.  IMPRESSION: No acute cardiopulmonary disease.   Electronically Signed   By: Amie Portland M.D.   On: 12/12/2013 16:43    Scheduled Meds: . amLODipine  10 mg Oral Daily  . divalproex  250 mg Oral QHS  . heparin  5,000 Units Subcutaneous Q8H  . insulin aspart  0-15 Units Subcutaneous TID WC  . insulin aspart  0-5 Units Subcutaneous QHS  . insulin glargine  25 Units Subcutaneous Daily  . LORazepam  0.5 mg Oral QHS  . metFORMIN  500 mg Oral BID WC  . OLANZapine zydis  10 mg Oral QHS  . pantoprazole  40 mg Oral Daily   Continuous Infusions: . sodium chloride 100 mL/hr at 12/14/13 0626  . dextrose 5 % and 0.45% NaCl 100 mL/hr at 12/13/13 1100    Active Problems:   DKA (diabetic ketoacidoses)   Hyponatremia   Acute kidney injury   Hypertension   Dementia    Time spent: 35 minutes    Jeralyn Bennett  [facility] Pager [phone]. If 7PM-7AM, please  contact night-coverage at [email or web address], password [facility] 12/14/2013, 12:13 PM  LOS: 2 days

## 2013-12-14 NOTE — Progress Notes (Signed)
Nutrition Brief Note  Patient identified on the Malnutrition Screening Tool (MST) Report  Wt Readings from Last 15 Encounters:  12/13/13 187 lb 6.4 oz (85.004 kg)  04/29/13 203 lb 14.8 oz (92.5 kg)    Body mass index is 32.15 kg/(m^2). Patient meets criteria for obesity grade1  based on current BMI.   Current diet order is CHO MOD, patient is consuming approximately 100% of meals at this time. Labs and medications reviewed.   Patient admitted with DKA and probable viral gastroenteritis from NH.  Now eating well per chart.  Noted weight loss above (8% in the past 7 months).  Hx noted includes dementia.  No nutrition interventions warranted at this time. If nutrition issues arise, please consult RD.   Oran Rein, RD, LDN Clinical Inpatient Dietitian Pager:  [phone] Weekend and after hours pager:  [phone]

## 2013-12-15 DIAGNOSIS — R4182 Altered mental status, unspecified: Secondary | ICD-10-CM

## 2013-12-15 LAB — GLUCOSE, CAPILLARY
Glucose-Capillary: 162 mg/dL — ABNORMAL HIGH (ref 70–99)
Glucose-Capillary: 194 mg/dL — ABNORMAL HIGH (ref 70–99)

## 2013-12-15 MED ORDER — LORAZEPAM 0.5 MG PO TABS: 0.5000 mg | ORAL_TABLET | Freq: Every day | ORAL | Status: DC

## 2013-12-15 MED ORDER — LORAZEPAM 0.5 MG PO TABS: 1.0000 mg | ORAL_TABLET | Freq: Every day | ORAL | Status: DC

## 2013-12-15 MED ORDER — METFORMIN HCL 500 MG PO TABS: 500.0000 mg | ORAL_TABLET | Freq: Two times a day (BID) | ORAL | Status: DC

## 2013-12-15 MED ORDER — TRAMADOL HCL 50 MG PO TABS: 50.0000 mg | ORAL_TABLET | Freq: Two times a day (BID) | ORAL | Status: DC

## 2013-12-15 MED ORDER — TRAMADOL HCL 50 MG PO TABS: 50.0000 mg | ORAL_TABLET | Freq: Two times a day (BID) | ORAL | Status: DC | PRN

## 2013-12-15 MED ORDER — INSULIN GLARGINE 100 UNIT/ML ~~LOC~~ SOLN: 25.0000 [IU] | Freq: Every day | SUBCUTANEOUS | Status: DC

## 2013-12-15 NOTE — Discharge Summary (Signed)
Physician Discharge Summary  Katrina Burton AVW:[id number] DOB: 06/05/1936 DOA: 12/12/2013  PCP: Default, Provider, MD  Admit date: 12/12/2013 Discharge date: 12/15/2013  Time spent: 36 minutes  Recommendations for Outpatient Follow-up:  1. Please followup on blood sugars, q. a.c. and at bedtime  Discharge Diagnoses:  Active Problems:   DKA (diabetic ketoacidoses)   Hyponatremia   Acute kidney injury   Hypertension   Dementia   Discharge Condition: Stable/improved  Diet recommendation: Carb consistent diabetic diet  Filed Weights   12/13/13 1740 12/13/13 2019  Weight: 84.777 kg (186 lb 14.4 oz) 85.004 kg (187 lb 6.4 oz)    History of present illness:  Katrina Burton is a 77 y.o. female with a past medical history of dementia, diabetes mellitus, hypertension, history of acute encephalopathy in setting of urinary tract infection who presents as a transfer from [facility]. She developed multiple episodes of nausea and vomiting, nonbloody of gastric content with associated generalized abdominal pain today. She was unable to hold by mouth down. Patient also reported having dizziness/lightheadedness, as well as polyuria. She states overall not feeling well today. Per medical records he has a history of diabetes mellitus however it appears that she had not been on diabetic medications at her facility. She reports, however, taking "diabetes pills" in the past, also stating that she wouldn't "do the shots." Lab work in the emergency room revealed a glucose of 643, with BMP showing an anion gap of 18. She was administered IV fluids and IV insulin in the emergency department.   Hospital Course:  Patient is a pleasant 77 year old female with a past medical history of diabetes per medical records presented as a transfer from her skilled nursing facility having a blood sugar of 643. She was admitted into the step down unit started on IV insulin and IV fluids. Initially  presented with an anion gap of 18 however bicarbonate within normal range. Prior to this presentation she had not been on hypoglycemic agents. By the following day her insulin drip was discontinued as blood sugars came down into the 100 range. She was initially started on Lantus 15 units subcutaneous daily. She was transferred to telemetry where her blood sugars were continued to be monitored. Do to elevated blood sugars consistently in the 300 range I increased her Lantus to 25 units subcutaneous daily and added metformin 500 mg twice a day. She was ambulated down the hallway, with her walker. Patient overall seems improved, was tolerating by mouth intake, remained hemodynamically stable and afebrile. By 12/15/2013 her blood sugars improved, with a.m. blood sugar of 164. Other issues addressed during this hospitalization included acute renal failure likely secondary to dehydration, initially presenting with a creatinine of 1.29. With IV fluid resuscitation her creatinine came down to 1.02. Patient also had a sodium of 127 on 12/13/2013 which improved to 133 on 12/14/2013. Because of her clinical stability, she was discharged in stable condition on 12/16/2003 to her skilled nursing facility. Please followup on Accu-Checks.   Consultations:  Physical therapy  Social Work  Discharge Exam: Filed Vitals:   12/15/13 0440  BP: 110/62  Pulse: 76  Temp: 97.2 F (36.2 C)  Resp: 18    General: Patient was helped out of bed to bedside chair for breakfast this morning, she looks well, feeding herself a.m. blood sugar 162 Cardiovascular: Regular rate rhythm normal S1-S2 Respiratory: Lungs are clear to auscultation bilaterally no wheezing rhonchi  Abdomen: Soft nontender nondistended Extremity: No edema  Discharge Instructions  Discharge Orders  Future Orders Complete By Expires   Call MD for:  difficulty breathing, headache or visual disturbances  As directed    Call MD for:  extreme fatigue  As  directed    Call MD for:  persistant dizziness or light-headedness  As directed    Call MD for:  persistant nausea and vomiting  As directed    Call MD for:  severe uncontrolled pain  As directed    Call MD for:  temperature >100.4  As directed    Diet - low sodium heart healthy  As directed    Discharge instructions  As directed    Comments:     Please follow up with your doctor in 1-2 weeks   Increase activity slowly  As directed        Medication List    STOP taking these medications       bumetanide 1 MG tablet  Commonly known as:  BUMEX     traZODone 150 MG tablet  Commonly known as:  DESYREL      TAKE these medications       acetaminophen 325 MG tablet  Commonly known as:  TYLENOL  Take 650 mg by mouth every 4 (four) hours as needed. For headache or fever     alum & mag hydroxide-simeth 200-200-20 MG/5ML suspension  Commonly known as:  MAALOX/MYLANTA  Take 30 mLs by mouth every 6 (six) hours as needed for indigestion or heartburn.     amLODipine 10 MG tablet  Commonly known as:  NORVASC  Take 10 mg by mouth daily.     divalproex 250 MG 24 hr tablet  Commonly known as:  DEPAKOTE ER  Take 250 mg by mouth at bedtime.     hydrOXYzine 25 MG tablet  Commonly known as:  ATARAX/VISTARIL  Take 25 mg by mouth every 6 (six) hours as needed for itching.     insulin glargine 100 UNIT/ML injection  Commonly known as:  LANTUS  Inject 0.25 mLs (25 Units total) into the skin daily.     loperamide 2 MG capsule  Commonly known as:  IMODIUM  Take 2 mg by mouth daily as needed. For diarrhea     LORazepam 0.5 MG tablet  Commonly known as:  ATIVAN  Take 2 tablets (1 mg total) by mouth at bedtime.     LORazepam 0.5 MG tablet  Commonly known as:  ATIVAN  Take 1 tablet (0.5 mg total) by mouth at bedtime.     magnesium hydroxide 400 MG/5ML suspension  Commonly known as:  MILK OF MAGNESIA  Take 30 mLs by mouth at bedtime as needed for mild constipation.     metFORMIN 500  MG tablet  Commonly known as:  GLUCOPHAGE  Take 1 tablet (500 mg total) by mouth 2 (two) times daily with a meal.     OLANZapine zydis 10 MG disintegrating tablet  Commonly known as:  ZYPREXA  Take 10 mg by mouth at bedtime.     pantoprazole 40 MG tablet  Commonly known as:  PROTONIX  Take 40 mg by mouth daily.     spironolactone 25 MG tablet  Commonly known as:  ALDACTONE  Take 25 mg by mouth daily.     traMADol 50 MG tablet  Commonly known as:  ULTRAM  Take 1 tablet (50 mg total) by mouth 2 (two) times daily. Foot pain.     traMADol 50 MG tablet  Commonly known as:  ULTRAM  Take 1 tablet (50  mg total) by mouth every 12 (twelve) hours as needed. 3 times daily for foot pain. May also take every 4 hours AS NEEDED for pain.       Allergies  Allergen Reactions  . Lasix [Furosemide]     hives  . Lipitor [Atorvastatin]     Hives   . Soap     Certain types of soap       Follow-up Information   Follow up with Default, Provider, MD In 1 week.   Contact information:   [address] [place] [address] [phone]        The results of significant diagnostics from this hospitalization (including imaging, microbiology, ancillary and laboratory) are listed below for reference.    Significant Diagnostic Studies: Dg Chest 2 View  12/12/2013   CLINICAL DATA:  Cough.  EXAM: CHEST  2 VIEW  COMPARISON:  04/29/2013  FINDINGS: Mild reticular opacity at the lung bases similar to the prior study likely scarring or minor subsegmental atelectasis. Lungs are otherwise clear.  Cardiac silhouette is normal in size mediastinum is normal in contour.  No pleural effusion or pneumothorax.  Bony thorax is intact.  IMPRESSION: No acute cardiopulmonary disease.   Electronically Signed   By: Amie Portland M.D.   On: 12/12/2013 16:43    Microbiology: Recent Results (from the past 240 hour(s))  MRSA PCR SCREENING     Status: None   Collection Time    12/12/13  8:12 PM      Result Value  Range Status   MRSA by PCR NEGATIVE  NEGATIVE Final   Comment:            The GeneXpert MRSA Assay (FDA     approved for NASAL specimens     only), is one component of a     comprehensive MRSA colonization     surveillance program. It is not     intended to diagnose MRSA     infection nor to guide or     monitor treatment for     MRSA infections.     Labs: Basic Metabolic Panel:  Recent Labs Lab 12/13/13 0750 12/13/13 0950 12/13/13 1220 12/13/13 1445 12/14/13 0445  NA 133* 134* 133* 127* 133*  K 3.6 4.0 4.1 3.5 3.5  CL 96 99 96 93* 103  CO2 28 26 26 22 20   GLUCOSE 121* 142* 216* 361* 342*  BUN 16 16 15 13 11   CREATININE 1.11* 1.13* 1.10 1.00 1.02  CALCIUM 9.5 9.3 9.3 9.0 9.3   Liver Function Tests: No results found for this basename: AST, ALT, ALKPHOS, BILITOT, PROT, ALBUMIN,  in the last 168 hours No results found for this basename: LIPASE, AMYLASE,  in the last 168 hours No results found for this basename: AMMONIA,  in the last 168 hours CBC:  Recent Labs Lab 12/12/13 1600 12/12/13 2222 12/13/13 0414 12/14/13 0445  WBC 7.4 8.4 7.2 6.0  HGB 14.5 15.0 12.8 12.6  HCT 41.1 41.5 36.6 36.2  MCV 90.7 88.1 90.4 90.7  PLT 253 254 211 195   Cardiac Enzymes: No results found for this basename: CKTOTAL, CKMB, CKMBINDEX, TROPONINI,  in the last 168 hours BNP: BNP (last 3 results)  Recent Labs  04/29/13 0408  PROBNP 87.8   CBG:  Recent Labs Lab 12/14/13 0747 12/14/13 1133 12/14/13 1702 12/14/13 2138 12/15/13 0740  GLUCAP 315* 392* 237* 74 162*       Signed:  Marselino Slayton  [facility] 12/15/2013, 7:54  AM    

## 2013-12-15 NOTE — Progress Notes (Signed)
Pt for discharge back to [facility] today.   CSW received return phone from Resident Care Coordinator at [facility] who confirmed discharge information reviewed and pt can return to facility.  CSW facilitated pt discharge needs including discussing with pt daughter via telephone and pt notified at bedside. CSW provided RN phone number to call report. CSW arranged non-emergency ambulance transport for pt to return to [facility]. (Service Request ID#: [id number]).  No further social work needs identified at this time.  CSW signing off.   Jacklynn Lewis, MSW, LCSWA  Clinical Social Work [phone]

## 2013-12-15 NOTE — Progress Notes (Signed)
CSW faxed pt discharge information to [facility] and spoke to Resident Care Coordinator, Tammy at ALF. Awaiting facility to review and confirm pt able to return.  CSW received phone call from pt daughter, Talbert Forest. Pt daughter discussed that she would not be able to transport and requesting non-emergency transport ([facility]). CSW discussed that CSW able to arrange transport, but unable to guarantee that pt insurance will cover transport. Pt daughter expressed understanding and agreeable to transfer via non-emergency ambulance.   CSW to continue to follow and facilitate pt discharge needs back to [place] once CSW received return phone call from facility.  Jacklynn Lewis, MSW, LCSWA  Clinical Social Work [phone]

## 2013-12-15 NOTE — Progress Notes (Signed)
Physical Therapy Treatment Patient Details Name: Katrina Burton MRN: [id number] DOB: 1936/11/07 Today's Date: 12/15/2013 Time: 1191-4782 PT Time Calculation (min): 17 min  PT Assessment / Plan / Recommendation  History of Present Illness Katrina Burton is a 77 y.o. female with a past medical history of dementia, diabetes mellitus, hypertension,  history of acute encephalopathy in setting of urinary tract infection who presents as a transfer from [place] [facility]. She developed multiple episodes of nausea and vomiting, nonbloody of gastric content with associated generalized abdominal pain today. She was unable to hold by mouth down. Patient also reported having dizziness/lightheadedness, as well as polyuria. She states overall not feeling well today.    PT Comments   Pt OOB in recliner feeling better with no c/o nausea.  Assisted to BR to void then amb in hallway.  Pt plans to D/C back to her SNF poss today.   Follow Up Recommendations  SNF     Does the patient have the potential to tolerate intense rehabilitation     Barriers to Discharge        Equipment Recommendations  Rolling walker with 5" wheels    Recommendations for Other Services    Frequency Min 3X/week   Progress towards PT Goals Progress towards PT goals: Progressing toward goals  Plan      Precautions / Restrictions Precautions Precautions: Fall Restrictions Weight Bearing Restrictions: No    Pertinent Vitals/Pain No c/o pain    Mobility  Bed Mobility Bed Mobility: Not assessed Details for Bed Mobility Assistance: Pt OOB in recliner Transfers Transfers: Sit to Stand;Stand to Sit Sit to Stand: 5: Supervision;4: Min guard;From chair/3-in-1;From toilet Stand to Sit: 5: Supervision;4: Min guard;To chair/3-in-1;To toilet Details for Transfer Assistance: for safety and intial balance  Ambulation/Gait Ambulation/Gait Assistance: 5: Supervision;4: Min guard Ambulation Distance (Feet): 140  Feet Assistive device: Rolling walker Ambulation/Gait Assistance Details: Pt liked the RW for increased stability and admits she had one a while ago but has not used one recently.  Pt required 25% VC's on safety with turns using RW and increased time, slow but steady gait. Gait Pattern: Decreased stride length;Trunk flexed Gait velocity: decreased     PT Goals (current goals can now be found in the care plan section)    Visit Information  Last PT Received On: 12/15/13 Assistance Needed: +1 History of Present Illness: Katrina Burton is a 77 y.o. female with a past medical history of dementia, diabetes mellitus, hypertension,  history of acute encephalopathy in setting of urinary tract infection who presents as a transfer from [place] [facility]. She developed multiple episodes of nausea and vomiting, nonbloody of gastric content with associated generalized abdominal pain today. She was unable to hold by mouth down. Patient also reported having dizziness/lightheadedness, as well as polyuria. She states overall not feeling well today.     Subjective Data      Cognition       Balance     End of Session PT - End of Session Equipment Utilized During Treatment: Gait belt Activity Tolerance: Patient tolerated treatment well Patient left: in chair;with call bell/phone within reach   Felecia Shelling  PTA [facility]  Acute  Rehab Pager      [phone]

## 2013-12-15 NOTE — Progress Notes (Signed)
Clinical Social Work Department BRIEF PSYCHOSOCIAL ASSESSMENT 12/15/2013  Patient:  Katrina Burton, Katrina Burton     Account Number:  [id number]     Admit date:  12/12/2013  Clinical Social Worker:  Jacelyn Grip  Date/Time:  12/15/2013 09:30 AM  Referred by:  Physician  Date Referred:  12/15/2013 Referred for  ALF Placement   Other Referral:   Interview type:  Patient Other interview type:   and patient granddaughter via telephone    PSYCHOSOCIAL DATA Living Status:  FACILITY Admitted from facility:  [facility] Level of care:  Assisted Living Primary support name:  Katrina Burton/daughter/[phone] Primary support relationship to patient:  CHILD, ADULT Degree of support available:   adequate    CURRENT CONCERNS Current Concerns  Post-Acute Placement   Other Concerns:    SOCIAL WORK ASSESSMENT / PLAN CSW received referral that pt admitted from [place] [facility]F (previously named [facility]) and is medically ready for discharge today.    CSW met with pt at bedside. Pt confirmed that she is a resident at [facility], but was unable to provide a lot of detail secondary to dementia. Pt did identify that she had a daughter that lived locally and provided CSW permission to contact pt daughter.    CSW contacted pt daughter, Katrina Burton, but pt daughter was not home at this time. CSW reviewed pt chart and noted that pt had a secondary contact listed on facesheet from ALF. CSW contacted phone number and reached pt granddaughter via telephone. Pt granddaughter confirmed pt is a resident at [facility] and plan is to return. Pt granddaughter stated that if facility cannot provide transportation then pt daughter will be able to provide transportation. Pt granddaughter plans to reach pt daughter to notify. CSW provided pt granddaughter with contact information to hospital if pt daughter had any questions for RN.    CSW contacted [place]  [facility] and left message with resident care coordinator. Awaiting response in order to fax pt discharge information.    CSW to continue to follow and facilitate pt discharge needs today.   Assessment/plan status:  Psychosocial Support/Ongoing Assessment of Needs Other assessment/ plan:   discharge planning   Information/referral to community resources:   Referral back to [facility]F    PATIENT'S/FAMILY'S RESPONSE TO PLAN OF CARE: Pt alert and oriented to person and place only. Pt granddaughter thankful for CSW phone call and appreciative of CSW assistance. Pt granddaughter reports that pt family agreeeable to pt return to [facility].    Katrina Burton, MSW, LCSW Clinical Social Work [phone]

## 2013-12-15 NOTE — Progress Notes (Signed)
Pt. Was discharged back to [facility]. Report was called to facility. Discharge packet and prescriptions was sent with transportation at discharge.

## 2014-01-17 ENCOUNTER — Emergency Department (HOSPITAL_COMMUNITY)
Admission: EM | Admit: 2014-01-17 | Discharge: 2014-01-18 | Disposition: A | Discharge status: Home / Self Care | Payer: Medicare Other | Attending: Emergency Medicine | Admitting: Emergency Medicine

## 2014-01-17 ENCOUNTER — Encounter (HOSPITAL_COMMUNITY): Payer: Self-pay | Admitting: Emergency Medicine

## 2014-01-17 DIAGNOSIS — R300 Dysuria: Secondary | ICD-10-CM | POA: Insufficient documentation

## 2014-01-17 DIAGNOSIS — Z794 Long term (current) use of insulin: Secondary | ICD-10-CM | POA: Insufficient documentation

## 2014-01-17 DIAGNOSIS — F0390 Unspecified dementia without behavioral disturbance: Secondary | ICD-10-CM | POA: Insufficient documentation

## 2014-01-17 DIAGNOSIS — E119 Type 2 diabetes mellitus without complications: Secondary | ICD-10-CM | POA: Insufficient documentation

## 2014-01-17 DIAGNOSIS — Z91040 Latex allergy status: Secondary | ICD-10-CM | POA: Insufficient documentation

## 2014-01-17 DIAGNOSIS — Z79899 Other long term (current) drug therapy: Secondary | ICD-10-CM | POA: Insufficient documentation

## 2014-01-17 DIAGNOSIS — R350 Frequency of micturition: Secondary | ICD-10-CM | POA: Insufficient documentation

## 2014-01-17 DIAGNOSIS — I10 Essential (primary) hypertension: Secondary | ICD-10-CM | POA: Insufficient documentation

## 2014-01-17 NOTE — ED Notes (Addendum)
Per EMS, [facility], states lower abdominal pain x1 month with burning with urination and frequency of urination. Pt ambulatory with EMS, NAD noted at this time. Pt hx dementia, a&o x4, states she was seen last month "for a lot of things" but doesn't think they checked her urine.

## 2014-01-17 NOTE — ED Notes (Signed)
Attempted blood draw while pt was in lobby. x2 attempts. Unsuccessful.

## 2014-01-18 LAB — CBC WITH DIFFERENTIAL/PLATELET
Basophils Absolute: 0 10*3/uL (ref 0.0–0.1)
Basophils Relative: 0 % (ref 0–1)
Eosinophils Absolute: 0.1 10*3/uL (ref 0.0–0.7)
Eosinophils Relative: 1 % (ref 0–5)
HCT: 34.3 % — ABNORMAL LOW (ref 36.0–46.0)
Hemoglobin: 11.4 g/dL — ABNORMAL LOW (ref 12.0–15.0)
LYMPHS ABS: 3.4 10*3/uL (ref 0.7–4.0)
Lymphocytes Relative: 46 % (ref 12–46)
MCH: 31 pg (ref 26.0–34.0)
MCHC: 33.2 g/dL (ref 30.0–36.0)
MCV: 93.2 fL (ref 78.0–100.0)
Monocytes Absolute: 0.6 10*3/uL (ref 0.1–1.0)
Monocytes Relative: 8 % (ref 3–12)
Neutro Abs: 3.3 10*3/uL (ref 1.7–7.7)
Neutrophils Relative %: 45 % (ref 43–77)
PLATELETS: 239 10*3/uL (ref 150–400)
RBC: 3.68 MIL/uL — AB (ref 3.87–5.11)
RDW: 13 % (ref 11.5–15.5)
WBC: 7.5 10*3/uL (ref 4.0–10.5)

## 2014-01-18 LAB — URINALYSIS, ROUTINE W REFLEX MICROSCOPIC
Bilirubin Urine: NEGATIVE
Glucose, UA: NEGATIVE mg/dL
Hgb urine dipstick: NEGATIVE
Ketones, ur: NEGATIVE mg/dL
Leukocytes, UA: NEGATIVE
NITRITE: NEGATIVE
Protein, ur: NEGATIVE mg/dL
SPECIFIC GRAVITY, URINE: 1.007 (ref 1.005–1.030)
Urobilinogen, UA: 1 mg/dL (ref 0.0–1.0)
pH: 7.5 (ref 5.0–8.0)

## 2014-01-18 LAB — COMPREHENSIVE METABOLIC PANEL
ALT: 9 U/L (ref 0–35)
AST: 19 U/L (ref 0–37)
Albumin: 3.5 g/dL (ref 3.5–5.2)
Alkaline Phosphatase: 73 U/L (ref 39–117)
BUN: 5 mg/dL — AB (ref 6–23)
CO2: 25 mEq/L (ref 19–32)
CREATININE: 0.85 mg/dL (ref 0.50–1.10)
Calcium: 9.8 mg/dL (ref 8.4–10.5)
Chloride: 104 mEq/L (ref 96–112)
GFR calc Af Amer: 76 mL/min — ABNORMAL LOW (ref >90)
GFR calc non Af Amer: 65 mL/min — ABNORMAL LOW (ref >90)
GLUCOSE: 102 mg/dL — AB (ref 70–99)
Potassium: 3.6 mEq/L — ABNORMAL LOW (ref 3.7–5.3)
Sodium: 142 mEq/L (ref 137–147)
TOTAL PROTEIN: 8 g/dL (ref 6.0–8.3)
Total Bilirubin: 0.5 mg/dL (ref 0.3–1.2)

## 2014-01-18 LAB — LIPASE, BLOOD: Lipase: 36 U/L (ref 11–59)

## 2014-01-18 MED ORDER — POTASSIUM CHLORIDE CRYS ER 20 MEQ PO TBCR
40.0000 meq | EXTENDED_RELEASE_TABLET | Freq: Once | ORAL | Status: AC
  Administered 2014-01-18: 40 meq via ORAL
  Filled 2014-01-18: qty 2

## 2014-01-18 NOTE — Discharge Instructions (Signed)

## 2014-01-18 NOTE — ED Notes (Signed)
Notified by Charge Nurse that Lab called at [phone] and informed that pt.'s blood that was collected at 2129 was clotted and FORGOT to call Nurse . Pt's lab works drawn  last night before pt. Was transferred to room 22.

## 2014-01-18 NOTE — ED Provider Notes (Signed)
CSN: [id number]     Arrival date & time 01/17/14  2015 History   First MD Initiated Contact with Patient 01/18/14 0034     Chief Complaint  Patient presents with  . Urinary Frequency   (Consider location/radiation/quality/duration/timing/severity/associated sxs/prior Treatment) HPI Hx per PT - from [facility], history of dementia, presents complaining of urinary frequency and dysuria. No F/C, no N/V. Symptoms worse over the last few days. Mild to mod in severity. No rash. No back pain. No ABD pain. Patient is a limited historian states onset a few days ago. No blood in urine. She is worried that she may have a urinary tract infection. Past Medical History  Diagnosis Date  . Dementia   . Hypertension   . Hyperlipidemia   . Diabetes mellitus    History reviewed. No pertinent past surgical history. History reviewed. No pertinent family history. History  Substance Use Topics  . Smoking status: Never Smoker   . Smokeless tobacco: Never Used  . Alcohol Use: No   OB History   Grav Para Term Preterm Abortions TAB SAB Ect Mult Living                 Review of Systems  Constitutional: Negative for fever and chills.  Eyes: Negative for pain.  Respiratory: Negative for shortness of breath.   Cardiovascular: Negative for chest pain.  Gastrointestinal: Negative for abdominal pain.  Genitourinary: Positive for dysuria and frequency.  Musculoskeletal: Negative for back pain, neck pain and neck stiffness.  Skin: Negative for rash.  Neurological: Negative for headaches.  All other systems reviewed and are negative.    Allergies  Lasix; Lipitor; and Soap  Home Medications   Current Outpatient Rx  Name  Route  Sig  Dispense  Refill  . acetaminophen (TYLENOL) 325 MG tablet   Oral   Take 650 mg by mouth every 4 (four) hours as needed. For headache or fever         . alum & mag hydroxide-simeth (MAALOX/MYLANTA) 200-200-20 MG/5ML suspension   Oral   Take 30 mLs by mouth every  6 (six) hours as needed for indigestion or heartburn.         Marland Kitchen amLODipine (NORVASC) 10 MG tablet   Oral   Take 10 mg by mouth daily.         . divalproex (DEPAKOTE ER) 250 MG 24 hr tablet   Oral   Take 250 mg by mouth at bedtime.         . hydrOXYzine (ATARAX/VISTARIL) 25 MG tablet   Oral   Take 25 mg by mouth every 6 (six) hours as needed for itching.         . insulin glargine (LANTUS) 100 UNIT/ML injection   Subcutaneous   Inject 0.25 mLs (25 Units total) into the skin daily.   10 mL   12   . loperamide (IMODIUM) 2 MG capsule   Oral   Take 2 mg by mouth daily as needed. For diarrhea         . LORazepam (ATIVAN) 0.5 MG tablet   Oral   Take 2 tablets (1 mg total) by mouth at bedtime.   30 tablet   0   . magnesium hydroxide (MILK OF MAGNESIA) 400 MG/5ML suspension   Oral   Take 30 mLs by mouth at bedtime as needed for mild constipation.         . metFORMIN (GLUCOPHAGE) 500 MG tablet   Oral   Take 1 tablet (  500 mg total) by mouth 2 (two) times daily with a meal.   60 tablet   1   . metoCLOPramide (REGLAN) 5 MG tablet   Oral   Take 5 mg by mouth daily.         Marland Kitchen. OLANZapine zydis (ZYPREXA) 10 MG disintegrating tablet   Oral   Take 10 mg by mouth at bedtime.         . pantoprazole (PROTONIX) 40 MG tablet   Oral   Take 40 mg by mouth daily.         Marland Kitchen. spironolactone (ALDACTONE) 25 MG tablet   Oral   Take 25 mg by mouth daily.         . traMADol (ULTRAM) 50 MG tablet   Oral   Take 1 tablet (50 mg total) by mouth 2 (two) times daily. Foot pain.   30 tablet   0    BP 146/86  Pulse 78  Temp(Src) 98.3 F (36.8 C) (Oral)  Resp 16  Wt 187 lb (84.823 kg)  SpO2 91% Physical Exam  Constitutional: She is oriented to person, place, and time. She appears well-developed and well-nourished.  HENT:  Head: Normocephalic and atraumatic.  Eyes: EOM are normal. Pupils are equal, round, and reactive to light.  Neck: Neck supple.  Cardiovascular:  Normal rate, regular rhythm and intact distal pulses.   Pulmonary/Chest: Effort normal. No respiratory distress.  Abdominal: Soft. Bowel sounds are normal. She exhibits no distension. There is no tenderness. There is no rebound and no guarding.  Musculoskeletal: Normal range of motion. She exhibits no edema.  Neurological: She is alert and oriented to person, place, and time.  Skin: Skin is warm and dry.    ED Course  Procedures (including critical care time) Labs Review Labs Reviewed  CBC WITH DIFFERENTIAL - Abnormal; Notable for the following:    RBC 3.68 (*)    Hemoglobin 11.4 (*)    HCT 34.3 (*)    All other components within normal limits  COMPREHENSIVE METABOLIC PANEL - Abnormal; Notable for the following:    Potassium 3.6 (*)    Glucose, Bld 102 (*)    BUN 5 (*)    GFR calc non Af Amer 65 (*)    GFR calc Af Amer 76 (*)    All other components within normal limits  LIPASE, BLOOD  URINALYSIS, ROUTINE W REFLEX MICROSCOPIC   Tolerates by mouth fluids without vomiting or abdominal pain. Serial ABD exams without tenderness.  Potassium provided for mild hypokalemia  Plan discharge back to facility, continue medications as prescribed and followup primary care physician. UA reviewed as above - no indication for urine culture MDM  Diagnosis: Dysuria, history of dementia   Evaluated with labs and urinalysis reviewed as above. Medications provided. Serial exams without indication for dance imaging or further workup at this time. Vital signs and nursing notes reviewed and considered  Sunnie NielsenBrian Luis Nickles, MD 01/18/14 [phone]

## 2014-06-09 ENCOUNTER — Other Ambulatory Visit: Payer: Self-pay | Admitting: Gastroenterology

## 2014-06-09 NOTE — Addendum Note (Signed)
Addended byVida Rigger: MAGOD, MARC on: 06/09/2014 10:55 AM   Modules accepted: Orders

## 2014-06-10 ENCOUNTER — Encounter (HOSPITAL_COMMUNITY): Payer: Self-pay | Admitting: Pharmacy Technician

## 2014-06-12 ENCOUNTER — Encounter (HOSPITAL_COMMUNITY): Payer: Self-pay | Admitting: *Deleted

## 2014-06-12 NOTE — Progress Notes (Signed)
Pt SDW pre-op call was completed by pt nurse Crystal. [facility] and pt will bring a copy of MAR as well as a copy of recent CXR to procedure ( per Crystal).

## 2014-06-15 ENCOUNTER — Encounter (HOSPITAL_COMMUNITY): Payer: Self-pay | Admitting: Certified Registered"

## 2014-06-15 ENCOUNTER — Ambulatory Visit (HOSPITAL_COMMUNITY): Payer: Medicare Other | Admitting: Anesthesiology

## 2014-06-15 ENCOUNTER — Ambulatory Visit (HOSPITAL_COMMUNITY)
Admission: RE | Admit: 2014-06-15 | Discharge: 2014-06-15 | Disposition: A | Discharge status: Home / Self Care | Payer: Medicare Other | Source: Ambulatory Visit | Attending: Gastroenterology | Admitting: Gastroenterology

## 2014-06-15 ENCOUNTER — Encounter (HOSPITAL_COMMUNITY): Payer: Medicare Other | Admitting: Anesthesiology

## 2014-06-15 ENCOUNTER — Encounter (HOSPITAL_COMMUNITY)
Admission: RE | Disposition: A | Discharge status: Home / Self Care | Payer: Self-pay | Source: Ambulatory Visit | Attending: Gastroenterology

## 2014-06-15 DIAGNOSIS — Z794 Long term (current) use of insulin: Secondary | ICD-10-CM | POA: Insufficient documentation

## 2014-06-15 DIAGNOSIS — R1310 Dysphagia, unspecified: Secondary | ICD-10-CM | POA: Diagnosis present

## 2014-06-15 DIAGNOSIS — E119 Type 2 diabetes mellitus without complications: Secondary | ICD-10-CM | POA: Diagnosis not present

## 2014-06-15 DIAGNOSIS — J449 Chronic obstructive pulmonary disease, unspecified: Secondary | ICD-10-CM | POA: Diagnosis not present

## 2014-06-15 DIAGNOSIS — K219 Gastro-esophageal reflux disease without esophagitis: Secondary | ICD-10-CM | POA: Diagnosis not present

## 2014-06-15 DIAGNOSIS — F0390 Unspecified dementia without behavioral disturbance: Secondary | ICD-10-CM | POA: Diagnosis not present

## 2014-06-15 DIAGNOSIS — J4489 Other specified chronic obstructive pulmonary disease: Secondary | ICD-10-CM | POA: Insufficient documentation

## 2014-06-15 DIAGNOSIS — I10 Essential (primary) hypertension: Secondary | ICD-10-CM | POA: Diagnosis not present

## 2014-06-15 HISTORY — PX: ESOPHAGOGASTRODUODENOSCOPY (EGD) WITH PROPOFOL: SHX5813

## 2014-06-15 HISTORY — PX: BOTOX INJECTION: SHX5754

## 2014-06-15 HISTORY — DX: Acute kidney failure, unspecified: N17.9

## 2014-06-15 LAB — POCT I-STAT 4, (NA,K, GLUC, HGB,HCT)
Glucose, Bld: 113 mg/dL — ABNORMAL HIGH (ref 70–99)
HCT: 38 % (ref 36.0–46.0)
Hemoglobin: 12.9 g/dL (ref 12.0–15.0)
Potassium: 4 mEq/L (ref 3.7–5.3)
Sodium: 136 mEq/L — ABNORMAL LOW (ref 137–147)

## 2014-06-15 SURGERY — ESOPHAGOGASTRODUODENOSCOPY (EGD) WITH PROPOFOL
Anesthesia: Monitor Anesthesia Care

## 2014-06-15 MED ORDER — LACTATED RINGERS IV SOLN
INTRAVENOUS | Status: DC | PRN
  Administered 2014-06-15: 11:00:00 via INTRAVENOUS

## 2014-06-15 MED ORDER — MIDAZOLAM HCL 5 MG/5ML IJ SOLN
INTRAMUSCULAR | Status: DC | PRN
  Administered 2014-06-15 (×2): 1 mg via INTRAVENOUS

## 2014-06-15 MED ORDER — BUTAMBEN-TETRACAINE-BENZOCAINE 2-2-14 % EX AERO
INHALATION_SPRAY | CUTANEOUS | Status: DC | PRN
  Administered 2014-06-15: 2 via TOPICAL

## 2014-06-15 MED ORDER — PROPOFOL INFUSION 10 MG/ML OPTIME
INTRAVENOUS | Status: DC | PRN
  Administered 2014-06-15: 100 ug/kg/min via INTRAVENOUS

## 2014-06-15 MED ORDER — ONDANSETRON HCL 4 MG/2ML IJ SOLN
INTRAMUSCULAR | Status: DC | PRN
  Administered 2014-06-15: 4 mg via INTRAVENOUS

## 2014-06-15 MED ORDER — ONABOTULINUMTOXINA 100 UNITS IJ SOLR
100.0000 [IU] | Freq: Once | INTRAMUSCULAR | Status: AC
  Administered 2014-06-15: 100 [IU] via INTRAMUSCULAR
  Filled 2014-06-15 (×2): qty 100

## 2014-06-15 MED ORDER — SODIUM CHLORIDE 0.9 % IV SOLN
INTRAVENOUS | Status: DC
Start: 2014-06-15 — End: 2014-06-15

## 2014-06-15 NOTE — Progress Notes (Signed)
Katrina Burton 9:49 AM  Subjective: Patient looks well today without any obvious change since I seen her in the office recently and no new complaints except for her swallowing  Objective: Vital signs stable no acute distress afebrile exam please see pre-assessment evaluation  Assessment: Multiple medical problems including swallowing problems  Plan: Okay to proceed with endoscopy with anesthesia assistance and either dilation or Botox injection if needed  [facility] CenterMAGOD,MARC E

## 2014-06-15 NOTE — Transfer of Care (Signed)
Immediate Anesthesia Transfer of Care Note  Patient: Katrina Burton  Procedure(s) Performed: Procedure(s) with comments: ESOPHAGOGASTRODUODENOSCOPY (EGD) WITH PROPOFOL (N/A) - h&p in file cabinet BOTOX INJECTION (N/A)  Patient Location: PACU and Endoscopy Unit  Anesthesia Type:MAC  Level of Consciousness: awake and alert   Airway & Oxygen Therapy: Patient Spontanous Breathing and Patient connected to nasal cannula oxygen  Post-op Assessment: Report given to PACU RN and Post -op Vital signs reviewed and stable  Post vital signs: Reviewed and stable  Complications: No apparent anesthesia complications

## 2014-06-15 NOTE — Anesthesia Postprocedure Evaluation (Signed)
  Anesthesia Post-op Note  Patient: Katrina Burton  Procedure(s) Performed: Procedure(s) with comments: ESOPHAGOGASTRODUODENOSCOPY (EGD) WITH PROPOFOL (N/A) - h&p in file cabinet BOTOX INJECTION (N/A)  Patient Location: PACU and Endoscopy Unit  Anesthesia Type:MAC  Level of Consciousness: awake  Airway and Oxygen Therapy: Patient Spontanous Breathing  Post-op Pain: mild  Post-op Assessment: Post-op Vital signs reviewed  Post-op Vital Signs: Reviewed  Last Vitals:  Filed Vitals:   06/15/14 1220  BP: 166/97  Pulse: 85  Temp:   Resp: 21    Complications: No apparent anesthesia complications

## 2014-06-15 NOTE — Discharge Instructions (Addendum)
Call if question or problem otherwise please call in one month with the swallowing update and let me know how this helped and if it does please call in a few months if she requires a second injection

## 2014-06-15 NOTE — Anesthesia Preprocedure Evaluation (Addendum)
Anesthesia Evaluation  Patient identified by MRN, date of birth, ID band Patient awake    Reviewed: Allergy & Precautions, H&P , NPO status , Patient's Chart, lab work & pertinent test results  Airway Mallampati: III TM Distance: >3 FB Neck ROM: Full    Dental no notable dental hx. (+) Teeth Intact, Dental Advisory Given   Pulmonary COPD COPD inhaler,  breath sounds clear to auscultation  Pulmonary exam normal       Cardiovascular hypertension, negative cardio ROS  Rhythm:Regular Rate:Normal     Neuro/Psych PSYCHIATRIC DISORDERS (dementia) negative neurological ROS     GI/Hepatic Neg liver ROS, GERD-  Medicated,  Endo/Other  diabetes, Insulin Dependent, Oral Hypoglycemic AgentsMorbid obesity  Renal/GU Renal InsufficiencyRenal disease  negative genitourinary   Musculoskeletal   Abdominal   Peds  Hematology negative hematology ROS (+)   Anesthesia Other Findings   Reproductive/Obstetrics negative OB ROS                        Anesthesia Physical Anesthesia Plan  ASA: III  Anesthesia Plan: MAC   Post-op Pain Management:    Induction: Intravenous  Airway Management Planned: Nasal Cannula  Additional Equipment:   Intra-op Plan:   Post-operative Plan:   Informed Consent: I have reviewed the patients History and Physical, chart, labs and discussed the procedure including the risks, benefits and alternatives for the proposed anesthesia with the patient or authorized representative who has indicated his/her understanding and acceptance.   Dental advisory given  Plan Discussed with: CRNA  Anesthesia Plan Comments:         Anesthesia Quick Evaluation

## 2014-06-15 NOTE — Progress Notes (Signed)
Pharmacy called for Botox....will tube to 25

## 2014-06-15 NOTE — Op Note (Signed)
[facility] [address] [place], [id number]   ENDOSCOPY PROCEDURE REPORT  PATIENT: Katrina Burton, Katrina Burton  MR#: [id number] BIRTHDATE: 11/29/1936 , 77  yrs. old GENDER: Female  ENDOSCOPIST: Vida RiggerMarc Magod, MD REFERRED BY:  PROCEDURE DATE:  06/15/2014 PROCEDURE:   EGD w/ directed submucosal injection(s), any substance Botox ASA CLASS:   Class III INDICATIONS:Dysphagia. probable achalasia  MEDICATIONS: propofol (Diprivan) 105mg  IV and Versed 2 mg IV  TOPICAL ANESTHETIC:used  DESCRIPTION OF PROCEDURE:   After the risks benefits and alternatives of the procedure were thoroughly explained, informed consent was obtained.  The PENTAX GASTOROSCOPE [id number]  endoscope was introduced through the mouth and advanced to the second portion of the duodenum , limited by Without limitations.   The instrument was slowly withdrawn as the mucosa was fully examined.the findings are recorded below and the patient tolerated the procedure well there was no obvious immediate complication         FINDINGS:#1. GE junction spasm and no obvious esophageal motility compatible with probable achalasia status post Botox injection in the customary fashion 1 cc x4 injections and a total of 100 mg used 2 atrophic gastritis 3. Otherwise within normal limits EGD  COMPLICATIONS:no ENDOSCOPIC IMPRESSION: above   RECOMMENDATIONS:observe for delayed complications if none see if this helps her swallowing and please call me if any question or problem or if this helps for a few months but then it wears off and she needs a repeat injection REPEAT EXAM: as needed   _______________________________ Vida RiggerMarc Magod, MD eSigned:  Vida RiggerMarc Magod, MD 06/15/2014 11:44 AM    CC:  PATIENT NAME:  Katrina Burton, Katrina Burton MR#: [id number]

## 2014-06-16 ENCOUNTER — Encounter (HOSPITAL_COMMUNITY): Payer: Self-pay | Admitting: Gastroenterology

## 2014-10-09 ENCOUNTER — Emergency Department (HOSPITAL_COMMUNITY): Payer: Medicare Other

## 2014-10-09 ENCOUNTER — Emergency Department (HOSPITAL_COMMUNITY)
Admission: EM | Admit: 2014-10-09 | Discharge: 2014-10-10 | Disposition: A | Discharge status: Home / Self Care | Payer: Medicare Other | Attending: Emergency Medicine | Admitting: Emergency Medicine

## 2014-10-09 ENCOUNTER — Encounter (HOSPITAL_COMMUNITY): Payer: Self-pay | Admitting: Emergency Medicine

## 2014-10-09 DIAGNOSIS — Z794 Long term (current) use of insulin: Secondary | ICD-10-CM | POA: Insufficient documentation

## 2014-10-09 DIAGNOSIS — F0390 Unspecified dementia without behavioral disturbance: Secondary | ICD-10-CM | POA: Insufficient documentation

## 2014-10-09 DIAGNOSIS — E785 Hyperlipidemia, unspecified: Secondary | ICD-10-CM | POA: Diagnosis not present

## 2014-10-09 DIAGNOSIS — Z87448 Personal history of other diseases of urinary system: Secondary | ICD-10-CM | POA: Insufficient documentation

## 2014-10-09 DIAGNOSIS — Z79899 Other long term (current) drug therapy: Secondary | ICD-10-CM | POA: Diagnosis not present

## 2014-10-09 DIAGNOSIS — E119 Type 2 diabetes mellitus without complications: Secondary | ICD-10-CM | POA: Insufficient documentation

## 2014-10-09 DIAGNOSIS — R319 Hematuria, unspecified: Secondary | ICD-10-CM | POA: Diagnosis not present

## 2014-10-09 DIAGNOSIS — M542 Cervicalgia: Secondary | ICD-10-CM | POA: Diagnosis not present

## 2014-10-09 DIAGNOSIS — M549 Dorsalgia, unspecified: Secondary | ICD-10-CM

## 2014-10-09 DIAGNOSIS — M545 Low back pain: Secondary | ICD-10-CM | POA: Diagnosis not present

## 2014-10-09 DIAGNOSIS — I10 Essential (primary) hypertension: Secondary | ICD-10-CM | POA: Insufficient documentation

## 2014-10-09 DIAGNOSIS — S39012A Strain of muscle, fascia and tendon of lower back, initial encounter: Secondary | ICD-10-CM

## 2014-10-09 DIAGNOSIS — R262 Difficulty in walking, not elsewhere classified: Secondary | ICD-10-CM

## 2014-10-09 LAB — BASIC METABOLIC PANEL
ANION GAP: 17 — AB (ref 5–15)
BUN: 20 mg/dL (ref 6–23)
CO2: 22 meq/L (ref 19–32)
CREATININE: 1.23 mg/dL — AB (ref 0.50–1.10)
Calcium: 10.7 mg/dL — ABNORMAL HIGH (ref 8.4–10.5)
Chloride: 102 mEq/L (ref 96–112)
GFR calc Af Amer: 48 mL/min — ABNORMAL LOW (ref >90)
GFR calc non Af Amer: 42 mL/min — ABNORMAL LOW (ref >90)
Glucose, Bld: 117 mg/dL — ABNORMAL HIGH (ref 70–99)
Potassium: 4.3 mEq/L (ref 3.7–5.3)
Sodium: 141 mEq/L (ref 137–147)

## 2014-10-09 LAB — URINALYSIS, ROUTINE W REFLEX MICROSCOPIC
BILIRUBIN URINE: NEGATIVE
GLUCOSE, UA: NEGATIVE mg/dL
Ketones, ur: NEGATIVE mg/dL
Leukocytes, UA: NEGATIVE
NITRITE: NEGATIVE
PH: 5.5 (ref 5.0–8.0)
Protein, ur: NEGATIVE mg/dL
SPECIFIC GRAVITY, URINE: 1.022 (ref 1.005–1.030)
Urobilinogen, UA: 1 mg/dL (ref 0.0–1.0)

## 2014-10-09 LAB — CBC
HCT: 34.7 % — ABNORMAL LOW (ref 36.0–46.0)
Hemoglobin: 12.4 g/dL (ref 12.0–15.0)
MCH: 33.3 pg (ref 26.0–34.0)
MCHC: 35.7 g/dL (ref 30.0–36.0)
MCV: 93.3 fL (ref 78.0–100.0)
Platelets: 274 10*3/uL (ref 150–400)
RBC: 3.72 MIL/uL — AB (ref 3.87–5.11)
RDW: 12.8 % (ref 11.5–15.5)
WBC: 13 10*3/uL — ABNORMAL HIGH (ref 4.0–10.5)

## 2014-10-09 LAB — URINE MICROSCOPIC-ADD ON

## 2014-10-09 LAB — CBG MONITORING, ED: Glucose-Capillary: 103 mg/dL — ABNORMAL HIGH (ref 70–99)

## 2014-10-09 MED ORDER — HYDROCODONE-ACETAMINOPHEN 5-325 MG PO TABS
1.0000 | ORAL_TABLET | Freq: Once | ORAL | Status: AC
  Administered 2014-10-09: 1 via ORAL
  Filled 2014-10-09: qty 1

## 2014-10-09 MED ORDER — HYDROCODONE-ACETAMINOPHEN 5-325 MG PO TABS: 2.0000 | ORAL_TABLET | ORAL | Status: DC | PRN

## 2014-10-09 MED ORDER — FENTANYL CITRATE 0.05 MG/ML IJ SOLN
25.0000 ug | Freq: Once | INTRAMUSCULAR | Status: AC
  Administered 2014-10-09: 25 ug via INTRAVENOUS
  Filled 2014-10-09: qty 2

## 2014-10-09 NOTE — ED Notes (Signed)
Pt's daughter if anything needed,  Violet BaldyShirley Holmes [phone]

## 2014-10-09 NOTE — ED Notes (Signed)
[facility] EMS [facility] with [facility]/o lower back, neck and right leg pain. PerEMS staff reports symptoms x 5 days, pt reports 3 weeks. Pt denies falls or injuries.

## 2014-10-09 NOTE — ED Provider Notes (Signed)
CSN: [id number]     Arrival date & time 10/09/14  1556 History   First MD Initiated Contact with Patient 10/09/14 1656     Chief Complaint  Patient presents with  . Back Pain  . Neck Pain  . Leg Pain     (Consider location/radiation/quality/duration/timing/severity/associated sxs/prior Treatment) HPI  Katrina Burton Is a 78 year old female brought in from her nursing facility for complaint of shooting pain in the left leg up to her head. Patient has a history of dementia. There is a level V caveat. History is gathered from nursing facility at the patient's home. The patient apparently began complaining of shooting pains in her legs and up to her head today. She normally takes tramadol and has a when necessary order for 500 mg Tylenol. Her daughter was notified of the patient's complaint and asked for her to come in to be evaluated. According to the nursing staff this is her first day complaining. She has otherwise been healthy. Able to ambulate at her facility. No recent falls. No fevers or signs of infection per nursing staff.  Past Medical History  Diagnosis Date  . Dementia   . Hypertension   . Hyperlipidemia   . Diabetes mellitus   . Acute kidney injury    Past Surgical History  Procedure Laterality Date  . Esophagogastroduodenoscopy (egd) with propofol N/A 06/15/2014    Procedure: ESOPHAGOGASTRODUODENOSCOPY (EGD) WITH PROPOFOL;  Surgeon: Petra KubaMarc E Magod, MD;  [facility];  Service: Endoscopy;  Laterality: N/A;  h&p in file cabinet  . Botox injection N/A 06/15/2014    Procedure: BOTOX INJECTION;  Surgeon: Petra KubaMarc E Magod, MD;  [facility];  Service: Endoscopy;  Laterality: N/A;   No family history on file. History  Substance Use Topics  . Smoking status: Never Smoker   . Smokeless tobacco: Never Used  . Alcohol Use: No   OB History   Grav Para Term Preterm Abortions TAB SAB Ect Mult Living                 Review of Systems  Unable to perform  ROS     Allergies  Lasix; Lipitor; and Soap  Home Medications   Prior to Admission medications   Medication Sig Start Date End Date Taking? Authorizing Provider  acetaminophen (TYLENOL) 325 MG tablet Take 650 mg by mouth every 4 (four) hours as needed for fever or headache.    Yes Historical Provider, MD  alum & mag hydroxide-simeth (MAALOX/MYLANTA) 200-200-20 MG/5ML suspension Take 30 mLs by mouth every 6 (six) hours as needed for indigestion or heartburn.   Yes Historical Provider, MD  amLODipine (NORVASC) 10 MG tablet Take 10 mg by mouth every morning.    Yes Historical Provider, MD  Cholecalciferol (VITAMIN D3) 2000 UNITS TABS Take 2,000 Units by mouth daily.   Yes Historical Provider, MD  gabapentin (NEURONTIN) 100 MG capsule Take 100-300 mg by mouth 3 (three) times daily. Takes 100mg  twice daily at 8am and at 2pm, then takes 300mg  every night at bedtime.   Yes Historical Provider, MD  guaifenesin (ROBITUSSIN) 100 MG/5ML syrup Take 200 mg by mouth every 6 (six) hours as needed for cough.   Yes Historical Provider, MD  hydrOXYzine (ATARAX/VISTARIL) 25 MG tablet Take 25 mg by mouth every 6 (six) hours as needed for itching.   Yes Historical Provider, MD  insulin lispro (HUMALOG) 100 UNIT/ML injection Inject 6 Units into the skin daily as needed for high blood sugar (Administer only if blood sugar  is over 350.).   Yes Historical Provider, MD  loperamide (IMODIUM) 2 MG capsule Take 2 mg by mouth every 3 (three) hours as needed for diarrhea or loose stools. For diarrhea   Yes Historical Provider, MD  LORazepam (ATIVAN) 0.5 MG tablet Take 2 tablets (1 mg total) by mouth at bedtime. 12/15/13  Yes Jeralyn BennettEzequiel Zamora, MD  magnesium hydroxide (MILK OF MAGNESIA) 400 MG/5ML suspension Take 30 mLs by mouth at bedtime as needed for mild constipation.   Yes Historical Provider, MD  metFORMIN (GLUCOPHAGE) 1000 MG tablet Take 1,000 mg by mouth 2 (two) times daily with a meal.   Yes Historical Provider, MD   metoCLOPramide (REGLAN) 5 MG tablet Take 5 mg by mouth 3 (three) times daily before meals.    Yes Historical Provider, MD  nystatin-triamcinolone (MYCOLOG II) cream Apply 1 application topically 2 (two) times daily.   Yes Historical Provider, MD  OLANZapine zydis (ZYPREXA) 10 MG disintegrating tablet Take 10 mg by mouth every evening.    Yes Historical Provider, MD  pantoprazole (PROTONIX) 40 MG tablet Take 40 mg by mouth daily.   Yes Historical Provider, MD  spironolactone (ALDACTONE) 25 MG tablet Take 25 mg by mouth daily.   Yes Historical Provider, MD  traMADol (ULTRAM) 50 MG tablet Take 1 tablet (50 mg total) by mouth 2 (two) times daily. Foot pain. 12/15/13  Yes Jeralyn BennettEzequiel Zamora, MD   BP 147/71  Pulse 94  Temp(Src) 98.3 F (36.8 C) (Oral)  Resp 16  SpO2 97% Physical Exam  Constitutional: She appears well-developed and well-nourished. No distress.  HENT:  Head: Normocephalic and atraumatic.  Eyes: Conjunctivae are normal. No scleral icterus.  Neck: Normal range of motion.  Cardiovascular: Normal rate, regular rhythm and normal heart sounds.  Exam reveals no gallop and no friction rub.   No murmur heard. Pulmonary/Chest: Effort normal and breath sounds normal. No respiratory distress.  Abdominal: Soft. Bowel sounds are normal. She exhibits no distension and no mass. There is no tenderness. There is no guarding.  Neurological: She is alert. She has normal reflexes.  Normal strength BL extermities. Negative straight leg raise Follows commands poorly  Skin: Skin is warm and dry. She is not diaphoretic.    ED Course  Procedures (including critical care time) Labs Review Labs Reviewed  CBC  BASIC METABOLIC PANEL  URINALYSIS, ROUTINE W REFLEX MICROSCOPIC  CBG MONITORING, ED    Imaging Review No results found.   EKG Interpretation None      MDM   Final diagnoses:  None    Patient with pain compaints. Labs appear at baselie for the patient . No signs of UTI.   Patient seen in shared visit with attending physician. Will obtain a stone study to r/o compression fracture or stone as cause.     Patient ct reading normal. She is able to ambulate with assistive device here in the ED. Review of the PT notes shows that this appears to be her baseline. Will discharge the patient with pain meds. Hemodynaically stable.    Arthor CaptainAbigail Yusra Ravert, PA-C 10/11/14 [phone]

## 2014-10-09 NOTE — ED Notes (Signed)
Pt out of room for CT 

## 2014-10-09 NOTE — ED Notes (Signed)
Pt needs 2-person assist to ambulate, put unable to walk more than a couple yards, became very short of breath. Pt also c/o worsening pain with movement.

## 2014-10-10 MED ORDER — HYDROCODONE-ACETAMINOPHEN 5-325 MG PO TABS: 1.0000 | ORAL_TABLET | Freq: Four times a day (QID) | ORAL | Status: DC | PRN

## 2014-10-10 NOTE — Discharge Instructions (Signed)
Please do not give the patient tramadol and vicodin together. Have the patient follow up with her primary care doctor as soon as possible.   Back Pain, Adult Low back pain is very common. About 1 in 5 people have back pain.The cause of low back pain is rarely dangerous. The pain often gets better over time.About half of people with a sudden onset of back pain feel better in just 2 weeks. About 8 in 10 people feel better by 6 weeks.  CAUSES Some common causes of back pain include:  Strain of the muscles or ligaments supporting the spine.  Wear and tear (degeneration) of the spinal discs.  Arthritis.  Direct injury to the back. DIAGNOSIS Most of the time, the direct cause of low back pain is not known.However, back pain can be treated effectively even when the exact cause of the pain is unknown.Answering your caregiver's questions about your overall health and symptoms is one of the most accurate ways to make sure the cause of your pain is not dangerous. If your caregiver needs more information, he or she may order lab work or imaging tests (X-rays or MRIs).However, even if imaging tests show changes in your back, this usually does not require surgery. HOME CARE INSTRUCTIONS For many people, back pain returns.Since low back pain is rarely dangerous, it is often a condition that people can learn to [facility] their own.   Remain active. It is stressful on the back to sit or stand in one place. Do not sit, drive, or stand in one place for more than 30 minutes at a time. Take short walks on level surfaces as soon as pain allows.Try to increase the length of time you walk each day.  Do not stay in bed.Resting more than 1 or 2 days can delay your recovery.  Do not avoid exercise or work.Your body is made to move.It is not dangerous to be active, even though your back may hurt.Your back will likely heal faster if you return to being active before your pain is gone.  Pay attention to your  body when you bend and lift. Many people have less discomfortwhen lifting if they bend their knees, keep the load close to their bodies,and avoid twisting. Often, the most comfortable positions are those that put less stress on your recovering back.  Find a comfortable position to sleep. Use a firm mattress and lie on your side with your knees slightly bent. If you lie on your back, put a pillow under your knees.  Only take over-the-counter or prescription medicines as directed by your caregiver. Over-the-counter medicines to reduce pain and inflammation are often the most helpful.Your caregiver may prescribe muscle relaxant drugs.These medicines help dull your pain so you can more quickly return to your normal activities and healthy exercise.  Put ice on the injured area.  Put ice in a plastic bag.  Place a towel between your skin and the bag.  Leave the ice on for 15-20 minutes, 03-04 times a day for the first 2 to 3 days. After that, ice and heat may be alternated to reduce pain and spasms.  Ask your caregiver about trying back exercises and gentle massage. This may be of some benefit.  Avoid feeling anxious or stressed.Stress increases muscle tension and can worsen back pain.It is important to recognize when you are anxious or stressed and learn ways to manage it.Exercise is a great option. SEEK MEDICAL CARE IF:  You have pain that is not relieved with rest  or medicine.  You have pain that does not improve in 1 week.  You have new symptoms.  You are generally not feeling well. SEEK IMMEDIATE MEDICAL CARE IF:   You have pain that radiates from your back into your legs.  You develop new bowel or bladder control problems.  You have unusual weakness or numbness in your arms or legs.  You develop nausea or vomiting.  You develop abdominal pain.  You feel faint. Document Released: 12/11/2005 Document Revised: 06/11/2012 Document Reviewed: 04/14/2014 [facility] Patient  Information 2015 [place], [place]. This information is not intended to replace advice given to you by your health care provider. Make sure you discuss any questions you have with your health care provider.

## 2014-10-12 NOTE — ED Provider Notes (Signed)
Medical screening examination/treatment/procedure(s) were conducted as a shared visit with non-physician practitioner(s) or resident and myself. I personally evaluated the patient during the encounter and agree with the findings.  I have personally reviewed any xrays and/ or EKG's with the provider and I agree with interpretation.  Patient from nursing home with lower back pain and neck pain. Patient does have dementia history and says to me that she's had this for weeks however she's told other people she's had it for a few days. No injuries witnessed. No focal weakness. On exam patient does have equal strength bilateral in upper lower extremities mild general weakness, patient has 5+ dorsi flexion plantar flexion of the great toe bilateral, equal 1+ reflexes lower extremities, patient has significant pain with sitting up and moving her lower back clinically mostly paraspinal lumbar. Abdomen soft nontender. Neck supple no midline tenderness, no meningismus. Pain medicines in ER, x-ray reviewed no acute fracture, plan to attempt to ambulate the patient and oral pain meds.  Lower back pain, dementia   Enid SkeensJoshua M Velmer Broadfoot, MD 10/12/14 [phone]

## 2019-08-25 ENCOUNTER — Emergency Department (HOSPITAL_COMMUNITY): Payer: Medicare Other

## 2019-08-25 ENCOUNTER — Other Ambulatory Visit: Payer: Self-pay

## 2019-08-25 ENCOUNTER — Inpatient Hospital Stay (HOSPITAL_COMMUNITY)
Admission: EM | Admit: 2019-08-25 | Discharge: 2019-08-29 | DRG: 864 | Disposition: A | Discharge status: Skilled Nursing Facility | Payer: Medicare Other | Source: Skilled Nursing Facility | Attending: Family Medicine | Admitting: Family Medicine

## 2019-08-25 ENCOUNTER — Encounter (HOSPITAL_COMMUNITY): Payer: Self-pay | Admitting: Emergency Medicine

## 2019-08-25 DIAGNOSIS — G9340 Encephalopathy, unspecified: Secondary | ICD-10-CM | POA: Diagnosis present

## 2019-08-25 DIAGNOSIS — Z794 Long term (current) use of insulin: Secondary | ICD-10-CM

## 2019-08-25 DIAGNOSIS — R509 Fever, unspecified: Secondary | ICD-10-CM | POA: Diagnosis not present

## 2019-08-25 DIAGNOSIS — Z888 Allergy status to other drugs, medicaments and biological substances status: Secondary | ICD-10-CM

## 2019-08-25 DIAGNOSIS — G309 Alzheimer's disease, unspecified: Secondary | ICD-10-CM | POA: Diagnosis present

## 2019-08-25 DIAGNOSIS — F0280 Dementia in other diseases classified elsewhere without behavioral disturbance: Secondary | ICD-10-CM | POA: Diagnosis present

## 2019-08-25 DIAGNOSIS — R0989 Other specified symptoms and signs involving the circulatory and respiratory systems: Secondary | ICD-10-CM | POA: Diagnosis present

## 2019-08-25 DIAGNOSIS — E785 Hyperlipidemia, unspecified: Secondary | ICD-10-CM | POA: Diagnosis present

## 2019-08-25 DIAGNOSIS — E119 Type 2 diabetes mellitus without complications: Secondary | ICD-10-CM | POA: Diagnosis present

## 2019-08-25 DIAGNOSIS — Z79899 Other long term (current) drug therapy: Secondary | ICD-10-CM

## 2019-08-25 DIAGNOSIS — J90 Pleural effusion, not elsewhere classified: Secondary | ICD-10-CM

## 2019-08-25 DIAGNOSIS — R7982 Elevated C-reactive protein (CRP): Secondary | ICD-10-CM | POA: Diagnosis present

## 2019-08-25 DIAGNOSIS — F0390 Unspecified dementia without behavioral disturbance: Secondary | ICD-10-CM | POA: Diagnosis present

## 2019-08-25 DIAGNOSIS — R309 Painful micturition, unspecified: Secondary | ICD-10-CM | POA: Diagnosis present

## 2019-08-25 DIAGNOSIS — Z79891 Long term (current) use of opiate analgesic: Secondary | ICD-10-CM

## 2019-08-25 DIAGNOSIS — R4182 Altered mental status, unspecified: Secondary | ICD-10-CM | POA: Diagnosis not present

## 2019-08-25 DIAGNOSIS — R197 Diarrhea, unspecified: Secondary | ICD-10-CM | POA: Diagnosis present

## 2019-08-25 DIAGNOSIS — R404 Transient alteration of awareness: Secondary | ICD-10-CM

## 2019-08-25 DIAGNOSIS — Z20828 Contact with and (suspected) exposure to other viral communicable diseases: Secondary | ICD-10-CM | POA: Diagnosis present

## 2019-08-25 DIAGNOSIS — Z91048 Other nonmedicinal substance allergy status: Secondary | ICD-10-CM

## 2019-08-25 DIAGNOSIS — I10 Essential (primary) hypertension: Secondary | ICD-10-CM | POA: Diagnosis present

## 2019-08-25 LAB — COMPREHENSIVE METABOLIC PANEL
ALT: 18 U/L (ref 0–44)
AST: 28 U/L (ref 15–41)
Albumin: 4 g/dL (ref 3.5–5.0)
Alkaline Phosphatase: 76 U/L (ref 38–126)
Anion gap: 13 (ref 5–15)
BUN: 18 mg/dL (ref 8–23)
CO2: 20 mmol/L — ABNORMAL LOW (ref 22–32)
Calcium: 10.1 mg/dL (ref 8.9–10.3)
Chloride: 102 mmol/L (ref 98–111)
Creatinine, Ser: 1.53 mg/dL — ABNORMAL HIGH (ref 0.44–1.00)
GFR calc Af Amer: 37 mL/min — ABNORMAL LOW (ref >60)
GFR calc non Af Amer: 32 mL/min — ABNORMAL LOW (ref >60)
Glucose, Bld: 131 mg/dL — ABNORMAL HIGH (ref 70–99)
Potassium: 5 mmol/L (ref 3.5–5.1)
Sodium: 135 mmol/L (ref 135–145)
Total Bilirubin: 0.7 mg/dL (ref 0.3–1.2)
Total Protein: 8.3 g/dL — ABNORMAL HIGH (ref 6.5–8.1)

## 2019-08-25 LAB — CBC WITH DIFFERENTIAL/PLATELET
Abs Immature Granulocytes: 0.04 10*3/uL (ref 0.00–0.07)
Basophils Absolute: 0 10*3/uL (ref 0.0–0.1)
Basophils Relative: 0 %
Eosinophils Absolute: 0 10*3/uL (ref 0.0–0.5)
Eosinophils Relative: 0 %
HCT: 37.9 % (ref 36.0–46.0)
Hemoglobin: 12 g/dL (ref 12.0–15.0)
Immature Granulocytes: 1 %
Lymphocytes Relative: 2 %
Lymphs Abs: 0.2 10*3/uL — ABNORMAL LOW (ref 0.7–4.0)
MCH: 29.9 pg (ref 26.0–34.0)
MCHC: 31.7 g/dL (ref 30.0–36.0)
MCV: 94.3 fL (ref 80.0–100.0)
Monocytes Absolute: 0.9 10*3/uL (ref 0.1–1.0)
Monocytes Relative: 10 %
Neutro Abs: 7.7 10*3/uL (ref 1.7–7.7)
Neutrophils Relative %: 87 %
Platelets: 233 10*3/uL (ref 150–400)
RBC: 4.02 MIL/uL (ref 3.87–5.11)
RDW: 13 % (ref 11.5–15.5)
WBC: 8.8 10*3/uL (ref 4.0–10.5)
nRBC: 0 % (ref 0.0–0.2)

## 2019-08-25 LAB — URINALYSIS, ROUTINE W REFLEX MICROSCOPIC
Bilirubin Urine: NEGATIVE
Glucose, UA: NEGATIVE mg/dL
Hgb urine dipstick: NEGATIVE
Ketones, ur: NEGATIVE mg/dL
Leukocytes,Ua: NEGATIVE
Nitrite: NEGATIVE
Protein, ur: NEGATIVE mg/dL
Specific Gravity, Urine: 1.018 (ref 1.005–1.030)
pH: 5 (ref 5.0–8.0)

## 2019-08-25 LAB — SARS CORONAVIRUS 2 BY RT PCR (HOSPITAL ORDER, PERFORMED IN ~~LOC~~ HOSPITAL LAB): SARS Coronavirus 2: NEGATIVE

## 2019-08-25 LAB — LACTIC ACID, PLASMA: Lactic Acid, Venous: 1.8 mmol/L (ref 0.5–1.9)

## 2019-08-25 MED ORDER — VANCOMYCIN HCL 10 G IV SOLR
1500.0000 mg | Freq: Once | INTRAVENOUS | Status: AC
  Administered 2019-08-25: 21:00:00 1500 mg via INTRAVENOUS
  Filled 2019-08-25: qty 1500

## 2019-08-25 MED ORDER — SODIUM CHLORIDE 0.9 % IV BOLUS: 1000.0000 mL | Freq: Once | INTRAVENOUS | Status: DC

## 2019-08-25 MED ORDER — ACETAMINOPHEN 325 MG PO TABS
650.0000 mg | ORAL_TABLET | Freq: Once | ORAL | Status: AC
  Administered 2019-08-25: 650 mg via ORAL
  Filled 2019-08-25: qty 2

## 2019-08-25 MED ORDER — VANCOMYCIN HCL IN DEXTROSE 1-5 GM/200ML-% IV SOLN: 1000.0000 mg | INTRAVENOUS | Status: DC

## 2019-08-25 MED ORDER — METRONIDAZOLE IN NACL 5-0.79 MG/ML-% IV SOLN
500.0000 mg | Freq: Once | INTRAVENOUS | Status: AC
  Administered 2019-08-25: 500 mg via INTRAVENOUS
  Filled 2019-08-25: qty 100

## 2019-08-25 MED ORDER — VANCOMYCIN HCL IN DEXTROSE 1-5 GM/200ML-% IV SOLN
1000.0000 mg | Freq: Once | INTRAVENOUS | Status: DC
  Filled 2019-08-25: qty 200

## 2019-08-25 MED ORDER — SODIUM CHLORIDE 0.9 % IV SOLN: 2.0000 g | Freq: Two times a day (BID) | INTRAVENOUS | Status: DC

## 2019-08-25 MED ORDER — IOHEXOL 300 MG/ML  SOLN
80.0000 mL | Freq: Once | INTRAMUSCULAR | Status: AC | PRN
  Administered 2019-08-25: 80 mL via INTRAVENOUS

## 2019-08-25 MED ORDER — SODIUM CHLORIDE 0.9 % IV SOLN
2.0000 g | Freq: Once | INTRAVENOUS | Status: AC
  Administered 2019-08-25: 2 g via INTRAVENOUS
  Filled 2019-08-25: qty 2

## 2019-08-25 NOTE — Progress Notes (Signed)
Pharmacy Antibiotic Note  Katrina Burton is a 83 y.o. female admitted on 08/25/2019 with sepsis.  Pharmacy has been consulted for vancomycin and cefepime dosing.  Temp 102.1, WBC wnl, SCr 1.53 (unknown BL)  Plan: Vancomycin 1500mg  IV x1, then 1000mg  every 24 hours Cefepime 2g IV every 12 hours Monitor renal function, Cx and clinical progression to narrow Vancomycin levels as needed  Weight: 169 lb 12.1 oz (77 kg)  Temp (24hrs), Avg:102.1 F (38.9 C), Min:102.1 F (38.9 C), Max:102.1 F (38.9 C)  No results for input(s): WBC, CREATININE, LATICACIDVEN, VANCOTROUGH, VANCOPEAK, VANCORANDOM, GENTTROUGH, GENTPEAK, GENTRANDOM, TOBRATROUGH, TOBRAPEAK, TOBRARND, AMIKACINPEAK, AMIKACINTROU, AMIKACIN in the last 168 hours.  CrCl cannot be calculated (Patient's most recent lab result is older than the maximum 21 days allowed.).    Allergies  Allergen Reactions  . Lasix [Furosemide] Hives  . Lipitor [Atorvastatin] Hives  . Soap Other (See Comments)    AGENT:Certain types of soap    Bertis Ruddy, PharmD Clinical Pharmacist Please check AMION for all [place] numbers 08/25/2019 8:05 PM

## 2019-08-25 NOTE — ED Provider Notes (Signed)
Patient with worsened AMS She is febrile Plan to f/u on CT imaging, admit for possible pneumonia    Ripley Fraise, MD 08/25/19 2351

## 2019-08-25 NOTE — ED Triage Notes (Signed)
Pt arrives via [facility] from [facility] for c/o of AMS and lethargy, staff unsure of when symptoms began. Staff also reports some urinary incontinence which is new. Hx of Alzheimers. Ems reports pt able to follow all commands, no weakness present in extremities. Pt is verbal and ambulatory at baseline. Pt alert, oriented to person.  Ems vss/CBG 131. 12 lead: sinus rhythm.

## 2019-08-25 NOTE — ED Notes (Signed)
This RN and Therapist, occupational attempted to site 2nd IV without success

## 2019-08-25 NOTE — ED Provider Notes (Signed)
[facility] EMERGENCY DEPARTMENT Provider Note   CSN: [id number] Arrival date & time: 08/25/19  1911    LEVEL 5 CAVEAT - ALTERED MENTAL STATUS   History   Chief Complaint Chief Complaint  Patient presents with  . Altered Mental Status  . Fatigue    HPI Timira Bieda is a 83 y.o. female.     HPI  83 year old female presents with altered mental status.  History is very limited as EMS is no longer present.  The nurse reports that it is unclear from the nursing facility how long the patient has been altered.  Unfortunately, when I call [facility], no one answers.  Patient is found to be febrile here.  The patient reports she feels short of breath though the rest of the history is very limited based on her dementia and altered mental state.  Past Medical History:  Diagnosis Date  . Acute kidney injury ([place])   . Dementia ([place])   . Diabetes mellitus   . Hyperlipidemia   . Hypertension     Patient Active Problem List   Diagnosis Date Noted  . DKA (diabetic ketoacidoses) ([place]) 12/12/2013  . Hyponatremia 12/12/2013  . Acute kidney injury ([place]) 12/12/2013  . UTI (lower urinary tract infection) 04/29/2013  . Altered mental status 04/29/2013  . DM (diabetes mellitus) ([place]) 04/29/2013  . Hypertension   . Dementia [facility])     Past Surgical History:  Procedure Laterality Date  . BOTOX INJECTION N/A 06/15/2014   Procedure: BOTOX INJECTION;  Surgeon: Jeryl Columbia, MD;  Location: [facility] ENDOSCOPY;  Service: Endoscopy;  Laterality: N/A;  . ESOPHAGOGASTRODUODENOSCOPY (EGD) WITH PROPOFOL N/A 06/15/2014   Procedure: ESOPHAGOGASTRODUODENOSCOPY (EGD) WITH PROPOFOL;  Surgeon: Jeryl Columbia, MD;  Location: [facility] ENDOSCOPY;  Service: Endoscopy;  Laterality: N/A;  h&p in file cabinet     OB History   No obstetric history on file.      Home Medications    Prior to Admission medications   Medication Sig Start Date End Date Taking? Authorizing Provider  acetaminophen  (TYLENOL) 325 MG tablet Take 650 mg by mouth every 4 (four) hours as needed for fever or headache.     [provider]  alum & mag hydroxide-simeth (MAALOX/MYLANTA) 200-200-20 MG/5ML suspension Take 30 mLs by mouth every 6 (six) hours as needed for indigestion or heartburn.    [provider]  amLODipine (NORVASC) 10 MG tablet Take 10 mg by mouth every morning.     [provider]  Cholecalciferol (VITAMIN D3) 2000 UNITS TABS Take 2,000 Units by mouth daily.    [provider]  gabapentin (NEURONTIN) 100 MG capsule Take 100-300 mg by mouth 3 (three) times daily. Takes 100mg  twice daily at 8am and at 2pm, then takes 300mg  every night at bedtime.    [provider]  guaifenesin (ROBITUSSIN) 100 MG/5ML syrup Take 200 mg by mouth every 6 (six) hours as needed for cough.    [provider]  HYDROcodone-acetaminophen (NORCO) 5-325 MG per tablet Take 1 tablet by mouth every 6 (six) hours as needed. 10/09/14   Margarita Mail, PA-C  hydrOXYzine (ATARAX/VISTARIL) 25 MG tablet Take 25 mg by mouth every 6 (six) hours as needed for itching.    [provider]  insulin lispro (HUMALOG) 100 UNIT/ML injection Inject 6 Units into the skin daily as needed for high blood sugar (Administer only if blood sugar is over 350.).    [provider]  loperamide (IMODIUM) 2 MG capsule  Take 2 mg by mouth every 3 (three) hours as needed for diarrhea or loose stools. For diarrhea    [provider]  LORazepam (ATIVAN) 0.5 MG tablet Take 2 tablets (1 mg total) by mouth at bedtime. 12/15/13   Jeralyn Bennett, MD  magnesium hydroxide (MILK OF MAGNESIA) 400 MG/5ML suspension Take 30 mLs by mouth at bedtime as needed for mild constipation.    [provider]  metFORMIN (GLUCOPHAGE) 1000 MG tablet Take 1,000 mg by mouth 2 (two) times daily with a meal.    [provider]  metoCLOPramide (REGLAN) 5 MG tablet Take 5 mg by mouth 3 (three)  times daily before meals.     [provider]  nystatin-triamcinolone (MYCOLOG II) cream Apply 1 application topically 2 (two) times daily.    [provider]  OLANZapine zydis (ZYPREXA) 10 MG disintegrating tablet Take 10 mg by mouth every evening.     [provider]  pantoprazole (PROTONIX) 40 MG tablet Take 40 mg by mouth daily.    [provider]  spironolactone (ALDACTONE) 25 MG tablet Take 25 mg by mouth daily.    [provider]  traMADol (ULTRAM) 50 MG tablet Take 1 tablet (50 mg total) by mouth 2 (two) times daily. Foot pain. 12/15/13   Jeralyn Bennett, MD    Family History No family history on file.  Social History Social History   Tobacco Use  . Smoking status: Never Smoker  . Smokeless tobacco: Never Used  Substance Use Topics  . Alcohol use: No  . Drug use: No     Allergies   Lasix [furosemide], Lipitor [atorvastatin], and Soap   Review of Systems Review of Systems  Unable to perform ROS: Mental status change     Physical Exam Updated Vital Signs BP 119/61   Pulse 74   Temp (S) 98.8 F (37.1 C) (Oral)   Resp (!) 24   Wt 77 kg   SpO2 94%   BMI 29.14 kg/m   Physical Exam Vitals signs and nursing note reviewed.  Constitutional:      Appearance: She is well-developed. She is obese.  HENT:     Head: Normocephalic and atraumatic.     Right Ear: External ear normal.     Left Ear: External ear normal.     Nose: Nose normal.  Eyes:     General:        Right eye: No discharge.        Left eye: No discharge.  Neck:     Musculoskeletal: Neck supple. No neck rigidity.  Cardiovascular:     Rate and Rhythm: Normal rate and regular rhythm.     Heart sounds: Normal heart sounds.  Pulmonary:     Effort: Pulmonary effort is normal. Tachypnea present.     Breath sounds: Normal breath sounds.  Abdominal:     Palpations: Abdomen is soft.     Tenderness: There is no abdominal tenderness.  Skin:    General: Skin  is warm and dry.     Findings: No erythema.     Comments: No obvious cellulitis  Neurological:     Mental Status: She is alert.  Psychiatric:        Mood and Affect: Mood is not anxious.      ED Treatments / Results  Labs (all labs ordered are listed, but only abnormal results are displayed) Labs Reviewed  COMPREHENSIVE METABOLIC PANEL - Abnormal; Notable for the following components:  Result Value   CO2 20 (*)    Glucose, Bld 131 (*)    Creatinine, Ser 1.53 (*)    Total Protein 8.3 (*)    GFR calc non Af Amer 32 (*)    GFR calc Af Amer 37 (*)    All other components within normal limits  CBC WITH DIFFERENTIAL/PLATELET - Abnormal; Notable for the following components:   Lymphs Abs 0.2 (*)    All other components within normal limits  SARS CORONAVIRUS 2 (HOSPITAL ORDER, PERFORMED IN [facility])  CULTURE, BLOOD (ROUTINE X 2)  CULTURE, BLOOD (ROUTINE X 2)  URINE CULTURE  LACTIC ACID, PLASMA  URINALYSIS, ROUTINE W REFLEX MICROSCOPIC  LACTIC ACID, PLASMA    EKG EKG Interpretation  Date/Time:  Monday August 25 2019 20:14:42 EDT Ventricular Rate:  83 PR Interval:    QRS Duration: 90 QT Interval:  352 QTC Calculation: 414 R Axis:   16 Text Interpretation:  Sinus rhythm Atrial premature complex Abnormal R-wave progression, early transition Nonspecific T abnormalities, lateral leads Confirmed by Pricilla LovelessGoldston, Avree Szczygiel [phone]([id number]) on 08/25/2019 8:53:31 PM   Radiology Ct Head Wo Contrast  Result Date: 08/25/2019 CLINICAL DATA:  Altered mental status. EXAM: CT HEAD WITHOUT CONTRAST TECHNIQUE: Contiguous axial images were obtained from the base of the skull through the vertex without intravenous contrast. COMPARISON:  01/19/2013 FINDINGS: Brain: No intracranial hemorrhage, mass effect, or midline shift. Brain volume is normal for age. Mild chronic small vessel ischemia. No hydrocephalus. The basilar cisterns are patent. No evidence of territorial infarct or acute ischemia.  No extra-axial or intracranial fluid collection. Vascular: Atherosclerosis of skullbase vasculature without hyperdense vessel or abnormal calcification. Skull: No fracture or focal lesion. Sinuses/Orbits: Mild mucosal thickening of right maxillary sinus. No sinus fluid level. Mastoid air cells are clear. The visualized orbits are unremarkable. Bilateral cataract resection. Other: None. IMPRESSION: 1. No acute intracranial abnormality. 2. Mild chronic small vessel ischemia. Electronically Signed   By: Narda RutherfordMelanie  Sanford M.D.   On: 08/25/2019 23:40   Dg Chest Portable 1 View  Result Date: 08/25/2019 CLINICAL DATA:  Reason for exam: AMS and fever [facility], staff unsure of when symptoms began Hx of HTN and Diabetes EXAM: PORTABLE CHEST 1 VIEW COMPARISON:  12/12/2013 FINDINGS: The heart size is enlarged and stable in configuration. There is atherosclerotic calcification of the thoracic aorta. Mild pulmonary vascular congestion is present. Suspect small RIGHT pleural effusion. No overt edema or consolidation. IMPRESSION: 1. Cardiomegaly and vascular congestion. 2. Suspect small RIGHT pleural effusion. Electronically Signed   By: Norva PavlovElizabeth  Brown M.D.   On: 08/25/2019 20:21    Procedures Procedures (including critical care time)  Medications Ordered in ED Medications  ceFEPIme (MAXIPIME) 2 g in sodium chloride 0.9 % 100 mL IVPB (has no administration in time range)  vancomycin (VANCOCIN) IVPB 1000 mg/200 mL premix (has no administration in time range)  acetaminophen (TYLENOL) tablet 650 mg (650 mg Oral Given 08/25/19 2040)  ceFEPIme (MAXIPIME) 2 g in sodium chloride 0.9 % 100 mL IVPB (0 g Intravenous Stopped 08/25/19 2106)  metroNIDAZOLE (FLAGYL) IVPB 500 mg (0 mg Intravenous Stopped 08/25/19 2349)  vancomycin (VANCOCIN) 1,500 mg in sodium chloride 0.9 % 500 mL IVPB (1,500 mg Intravenous New Bag/Given 08/25/19 2117)  iohexol (OMNIPAQUE) 300 MG/ML  solution 80 mL (80 mLs Intravenous Contrast Given 08/25/19 2318)     Initial Impression / Assessment and Plan / ED Course  I have  reviewed the triage vital signs and the nursing notes.  Pertinent labs & imaging results that were available during my care of the patient were reviewed by me and considered in my medical decision making (see chart for details).        Patient presents with altered mental state according to the nursing home.  Unfortunately I could not get in contact with them.  Family also did not answer on a call.  Given she reported some abdominal pain to me, CT will be obtained.  CT head and other labs are pretty reassuring.  Possible pneumonia and she does report some shortness of breath so I have given her broad antibiotics.  I think given the report of change in mental status she will need to be admitted.  Care to Dr. Bebe ShaggyWickline.  Final Clinical Impressions(s) / ED Diagnoses   Final diagnoses:  None    ED Discharge Orders    None       Pricilla LovelessGoldston, Khyli Swaim, MD 08/25/19 2359

## 2019-08-26 ENCOUNTER — Other Ambulatory Visit: Payer: Self-pay

## 2019-08-26 DIAGNOSIS — G309 Alzheimer's disease, unspecified: Secondary | ICD-10-CM

## 2019-08-26 DIAGNOSIS — R404 Transient alteration of awareness: Secondary | ICD-10-CM | POA: Diagnosis not present

## 2019-08-26 DIAGNOSIS — R0989 Other specified symptoms and signs involving the circulatory and respiratory systems: Secondary | ICD-10-CM | POA: Diagnosis present

## 2019-08-26 DIAGNOSIS — Z79891 Long term (current) use of opiate analgesic: Secondary | ICD-10-CM | POA: Diagnosis not present

## 2019-08-26 DIAGNOSIS — F0280 Dementia in other diseases classified elsewhere without behavioral disturbance: Secondary | ICD-10-CM | POA: Diagnosis present

## 2019-08-26 DIAGNOSIS — R309 Painful micturition, unspecified: Secondary | ICD-10-CM | POA: Diagnosis present

## 2019-08-26 DIAGNOSIS — R7982 Elevated C-reactive protein (CRP): Secondary | ICD-10-CM | POA: Diagnosis present

## 2019-08-26 DIAGNOSIS — Z20828 Contact with and (suspected) exposure to other viral communicable diseases: Secondary | ICD-10-CM | POA: Diagnosis present

## 2019-08-26 DIAGNOSIS — E119 Type 2 diabetes mellitus without complications: Secondary | ICD-10-CM | POA: Diagnosis not present

## 2019-08-26 DIAGNOSIS — R4182 Altered mental status, unspecified: Secondary | ICD-10-CM | POA: Diagnosis present

## 2019-08-26 DIAGNOSIS — Z888 Allergy status to other drugs, medicaments and biological substances status: Secondary | ICD-10-CM | POA: Diagnosis not present

## 2019-08-26 DIAGNOSIS — Z91048 Other nonmedicinal substance allergy status: Secondary | ICD-10-CM | POA: Diagnosis not present

## 2019-08-26 DIAGNOSIS — Z794 Long term (current) use of insulin: Secondary | ICD-10-CM | POA: Diagnosis not present

## 2019-08-26 DIAGNOSIS — R509 Fever, unspecified: Principal | ICD-10-CM | POA: Diagnosis present

## 2019-08-26 DIAGNOSIS — I10 Essential (primary) hypertension: Secondary | ICD-10-CM | POA: Diagnosis not present

## 2019-08-26 DIAGNOSIS — E785 Hyperlipidemia, unspecified: Secondary | ICD-10-CM | POA: Diagnosis present

## 2019-08-26 DIAGNOSIS — G9340 Encephalopathy, unspecified: Secondary | ICD-10-CM | POA: Diagnosis present

## 2019-08-26 DIAGNOSIS — R197 Diarrhea, unspecified: Secondary | ICD-10-CM | POA: Diagnosis not present

## 2019-08-26 DIAGNOSIS — J90 Pleural effusion, not elsewhere classified: Secondary | ICD-10-CM | POA: Diagnosis present

## 2019-08-26 DIAGNOSIS — Z79899 Other long term (current) drug therapy: Secondary | ICD-10-CM | POA: Diagnosis not present

## 2019-08-26 LAB — CBC
HCT: 34.8 % — ABNORMAL LOW (ref 36.0–46.0)
Hemoglobin: 10.9 g/dL — ABNORMAL LOW (ref 12.0–15.0)
MCH: 29.6 pg (ref 26.0–34.0)
MCHC: 31.3 g/dL (ref 30.0–36.0)
MCV: 94.6 fL (ref 80.0–100.0)
Platelets: 206 10*3/uL (ref 150–400)
RBC: 3.68 MIL/uL — ABNORMAL LOW (ref 3.87–5.11)
RDW: 13.2 % (ref 11.5–15.5)
WBC: 6.9 10*3/uL (ref 4.0–10.5)
nRBC: 0 % (ref 0.0–0.2)

## 2019-08-26 LAB — COMPREHENSIVE METABOLIC PANEL
ALT: 14 U/L (ref 0–44)
AST: 19 U/L (ref 15–41)
Albumin: 3.3 g/dL — ABNORMAL LOW (ref 3.5–5.0)
Alkaline Phosphatase: 70 U/L (ref 38–126)
Anion gap: 6 (ref 5–15)
BUN: 16 mg/dL (ref 8–23)
CO2: 21 mmol/L — ABNORMAL LOW (ref 22–32)
Calcium: 9.3 mg/dL (ref 8.9–10.3)
Chloride: 103 mmol/L (ref 98–111)
Creatinine, Ser: 1.42 mg/dL — ABNORMAL HIGH (ref 0.44–1.00)
GFR calc Af Amer: 40 mL/min — ABNORMAL LOW (ref >60)
GFR calc non Af Amer: 35 mL/min — ABNORMAL LOW (ref >60)
Glucose, Bld: 113 mg/dL — ABNORMAL HIGH (ref 70–99)
Potassium: 4.3 mmol/L (ref 3.5–5.1)
Sodium: 130 mmol/L — ABNORMAL LOW (ref 135–145)
Total Bilirubin: 0.6 mg/dL (ref 0.3–1.2)
Total Protein: 7.2 g/dL (ref 6.5–8.1)

## 2019-08-26 LAB — C-REACTIVE PROTEIN: CRP: 5.1 mg/dL — ABNORMAL HIGH (ref <1.0)

## 2019-08-26 LAB — URINE CULTURE: Culture: <10000 — AB

## 2019-08-26 LAB — HEMOGLOBIN A1C
Hgb A1c MFr Bld: 6.5 % — ABNORMAL HIGH (ref 4.8–5.6)
Mean Plasma Glucose: 139.85 mg/dL

## 2019-08-26 LAB — C DIFFICILE QUICK SCREEN W PCR REFLEX
C Diff antigen: NEGATIVE
C Diff interpretation: NOT DETECTED
C Diff toxin: NEGATIVE

## 2019-08-26 LAB — BRAIN NATRIURETIC PEPTIDE: B Natriuretic Peptide: 163.9 pg/mL — ABNORMAL HIGH (ref 0.0–100.0)

## 2019-08-26 LAB — SARS CORONAVIRUS 2 BY RT PCR (HOSPITAL ORDER, PERFORMED IN ~~LOC~~ HOSPITAL LAB): SARS Coronavirus 2: NEGATIVE

## 2019-08-26 LAB — GLUCOSE, CAPILLARY
Glucose-Capillary: 92 mg/dL (ref 70–99)
Glucose-Capillary: 92 mg/dL (ref 70–99)
Glucose-Capillary: 95 mg/dL (ref 70–99)

## 2019-08-26 LAB — PROCALCITONIN: Procalcitonin: 0.21 ng/mL

## 2019-08-26 LAB — LACTIC ACID, PLASMA: Lactic Acid, Venous: 1.2 mmol/L (ref 0.5–1.9)

## 2019-08-26 LAB — CBG MONITORING, ED: Glucose-Capillary: 93 mg/dL (ref 70–99)

## 2019-08-26 LAB — HIV ANTIBODY (ROUTINE TESTING W REFLEX): HIV Screen 4th Generation wRfx: NONREACTIVE

## 2019-08-26 LAB — D-DIMER, QUANTITATIVE: D-Dimer, Quant: 4.58 ug/mL-FEU — ABNORMAL HIGH (ref 0.00–0.50)

## 2019-08-26 MED ORDER — ESCITALOPRAM OXALATE 10 MG PO TABS
10.0000 mg | ORAL_TABLET | Freq: Every day | ORAL | Status: DC
  Administered 2019-08-27 – 2019-08-29 (×3): 10 mg via ORAL
  Filled 2019-08-26 (×3): qty 1

## 2019-08-26 MED ORDER — AMLODIPINE BESYLATE 10 MG PO TABS
10.0000 mg | ORAL_TABLET | Freq: Every morning | ORAL | Status: DC
  Administered 2019-08-27 – 2019-08-29 (×3): 10 mg via ORAL
  Filled 2019-08-26: qty 2
  Filled 2019-08-26 (×2): qty 1

## 2019-08-26 MED ORDER — LORAZEPAM 1 MG PO TABS
1.0000 mg | ORAL_TABLET | Freq: Every day | ORAL | Status: DC
  Administered 2019-08-26 – 2019-08-29 (×4): 1 mg via ORAL
  Filled 2019-08-26 (×4): qty 1

## 2019-08-26 MED ORDER — PANTOPRAZOLE SODIUM 20 MG PO TBEC
20.0000 mg | DELAYED_RELEASE_TABLET | Freq: Every day | ORAL | Status: DC
  Administered 2019-08-27 – 2019-08-29 (×3): 20 mg via ORAL
  Filled 2019-08-26 (×3): qty 1

## 2019-08-26 MED ORDER — INSULIN ASPART 100 UNIT/ML ~~LOC~~ SOLN
0.0000 [IU] | Freq: Three times a day (TID) | SUBCUTANEOUS | Status: DC
  Administered 2019-08-27: 2 [IU] via SUBCUTANEOUS
  Administered 2019-08-28: 5 [IU] via SUBCUTANEOUS
  Administered 2019-08-29 (×2): 2 [IU] via SUBCUTANEOUS

## 2019-08-26 MED ORDER — INSULIN ASPART 100 UNIT/ML ~~LOC~~ SOLN: 0.0000 [IU] | Freq: Every day | SUBCUTANEOUS | Status: DC

## 2019-08-26 MED ORDER — AZITHROMYCIN 250 MG PO TABS
500.0000 mg | ORAL_TABLET | Freq: Every day | ORAL | Status: DC
  Administered 2019-08-26 – 2019-08-27 (×2): 500 mg via ORAL
  Filled 2019-08-26 (×2): qty 2

## 2019-08-26 MED ORDER — ENOXAPARIN SODIUM 40 MG/0.4ML ~~LOC~~ SOLN
40.0000 mg | Freq: Every day | SUBCUTANEOUS | Status: DC
  Administered 2019-08-26 – 2019-08-29 (×4): 40 mg via SUBCUTANEOUS
  Filled 2019-08-26 (×5): qty 0.4

## 2019-08-26 MED ORDER — SODIUM CHLORIDE 0.9 % IV SOLN
2.0000 g | INTRAVENOUS | Status: DC
  Administered 2019-08-26 – 2019-08-27 (×2): 2 g via INTRAVENOUS
  Filled 2019-08-26: qty 2
  Filled 2019-08-26 (×2): qty 20

## 2019-08-26 MED ORDER — OLANZAPINE 5 MG PO TBDP
10.0000 mg | ORAL_TABLET | Freq: Every day | ORAL | Status: DC
  Administered 2019-08-26 – 2019-08-29 (×4): 10 mg via ORAL
  Filled 2019-08-26 (×5): qty 2

## 2019-08-26 MED ORDER — SODIUM CHLORIDE 0.9 % IV SOLN
INTRAVENOUS | Status: DC
  Administered 2019-08-26 – 2019-08-27 (×3): via INTRAVENOUS

## 2019-08-26 MED ORDER — ACETAMINOPHEN 325 MG PO TABS
650.0000 mg | ORAL_TABLET | Freq: Four times a day (QID) | ORAL | Status: DC | PRN
  Administered 2019-08-26 – 2019-08-29 (×4): 650 mg via ORAL
  Filled 2019-08-26 (×4): qty 2

## 2019-08-26 MED ORDER — TRAMADOL HCL 50 MG PO TABS
50.0000 mg | ORAL_TABLET | Freq: Two times a day (BID) | ORAL | Status: DC
  Administered 2019-08-26 – 2019-08-29 (×7): 50 mg via ORAL
  Filled 2019-08-26 (×7): qty 1

## 2019-08-26 NOTE — H&P (Addendum)
History and Physical    Katrina Burton VWU:[id number] DOB: Oct 24, 1936 DOA: 08/25/2019  PCP: Ron ParkerBowen, Samuel, MD  [facility]  I have personally briefly reviewed patient's old medical [facility]  Chief Complaint: AMS  HPI: Katrina Burton is a 83 y.o. female with medical history significant of alzheimer's dz, DM, HTN.  Patient apparently was altered earlier today and so sent in to ED by SNF.  AMS has thankfully resolved and patient now AAO.  Per patient she has been having some SOB, diarrhea, cough, and abd pain.  Not really clear on how long these going on for.   ED Course: Tm 102.1.  CXR just shows pulm vasc congestion, ? R pleural effusion (that they dont end up calling on CT abd/pelvis).  CT abd/pelvis just shows diarrheal illness.  WBC nl.  COVID is negative.  UA neg.  CT head neg.  Patient put on empiric cefepime and vanc.   Review of Systems: As per HPI, otherwise all review of systems negative.  Past Medical History:  Diagnosis Date  . Acute kidney injury (HCC)   . Dementia (HCC)   . Diabetes mellitus   . Hyperlipidemia   . Hypertension     Past Surgical History:  Procedure Laterality Date  . BOTOX INJECTION N/A 06/15/2014   Procedure: BOTOX INJECTION;  Surgeon: Petra KubaMarc E Magod, MD;  [facility];  Service: Endoscopy;  Laterality: N/A;  . ESOPHAGOGASTRODUODENOSCOPY (EGD) WITH PROPOFOL N/A 06/15/2014   Procedure: ESOPHAGOGASTRODUODENOSCOPY (EGD) WITH PROPOFOL;  Surgeon: Petra KubaMarc E Magod, MD;  [facility];  Service: Endoscopy;  Laterality: N/A;  h&p in file cabinet     reports that she has never smoked. She has never used smokeless tobacco. She reports that she does not drink alcohol or use drugs.  Allergies  Allergen Reactions  . Lasix [Furosemide] Hives  . Lipitor [Atorvastatin] Hives  . Soap Other (See Comments)    AGENT:Certain types of soap    No family history on file. No sick contacts  in family, though patient lives in facility.  Prior to Admission medications   Medication Sig Start Date End Date Taking? Authorizing Provider  acetaminophen (TYLENOL) 325 MG tablet Take 650 mg by mouth every 4 (four) hours as needed for fever or headache.     [provider]  alum & mag hydroxide-simeth (MAALOX/MYLANTA) 200-200-20 MG/5ML suspension Take 30 mLs by mouth every 6 (six) hours as needed for indigestion or heartburn.    [provider]  amLODipine (NORVASC) 10 MG tablet Take 10 mg by mouth every morning.     [provider]  Cholecalciferol (VITAMIN D3) 2000 UNITS TABS Take 2,000 Units by mouth daily.    [provider]  gabapentin (NEURONTIN) 100 MG capsule Take 100-300 mg by mouth 3 (three) times daily. Takes 100mg  twice daily at 8am and at 2pm, then takes 300mg  every night at bedtime.    [provider]  guaifenesin (ROBITUSSIN) 100 MG/5ML syrup Take 200 mg by mouth every 6 (six) hours as needed for cough.    [provider]  HYDROcodone-acetaminophen (NORCO) 5-325 MG per tablet Take 1 tablet by mouth every 6 (six) hours as needed. 10/09/14   Arthor CaptainHarris, Abigail, PA-C  hydrOXYzine (ATARAX/VISTARIL) 25 MG tablet Take 25 mg by mouth every 6 (six) hours as needed for itching.    [provider]  insulin lispro (HUMALOG) 100 UNIT/ML injection Inject 6 Units into the skin daily as needed for high blood  sugar (Administer only if blood sugar is over 350.).    [provider]  loperamide (IMODIUM) 2 MG capsule Take 2 mg by mouth every 3 (three) hours as needed for diarrhea or loose stools. For diarrhea    [provider]  LORazepam (ATIVAN) 0.5 MG tablet Take 2 tablets (1 mg total) by mouth at bedtime. 12/15/13   Jeralyn BennettZamora, Ezequiel, MD  magnesium hydroxide (MILK OF MAGNESIA) 400 MG/5ML suspension Take 30 mLs by mouth at bedtime as needed for mild constipation.    [provider]  metFORMIN (GLUCOPHAGE) 1000 MG  tablet Take 1,000 mg by mouth 2 (two) times daily with a meal.    [provider]  metoCLOPramide (REGLAN) 5 MG tablet Take 5 mg by mouth 3 (three) times daily before meals.     [provider]  nystatin-triamcinolone (MYCOLOG II) cream Apply 1 application topically 2 (two) times daily.    [provider]  OLANZapine zydis (ZYPREXA) 10 MG disintegrating tablet Take 10 mg by mouth every evening.     [provider]  pantoprazole (PROTONIX) 40 MG tablet Take 40 mg by mouth daily.    [provider]  spironolactone (ALDACTONE) 25 MG tablet Take 25 mg by mouth daily.    [provider]  traMADol (ULTRAM) 50 MG tablet Take 1 tablet (50 mg total) by mouth 2 (two) times daily. Foot pain. 12/15/13   Jeralyn BennettZamora, Ezequiel, MD    Physical Exam: Vitals:   08/25/19 2330 08/26/19 0000 08/26/19 0015 08/26/19 0030  BP:  125/62 (!) 125/58 126/65  Pulse: 74     Resp: (!) 24 (!) 25    Temp:      TempSrc:      SpO2: 94%     Weight:        Constitutional: NAD, calm, comfortable Eyes: PERRL, lids and conjunctivae normal ENMT: Mucous membranes are moist. Posterior pharynx clear of any exudate or lesions.Normal dentition.  Neck: normal, supple, no masses, no thyromegaly Respiratory: clear to auscultation bilaterally, no wheezing, no crackles. Normal respiratory effort. No accessory muscle use.  Cardiovascular: Regular rate and rhythm, no murmurs / rubs / gallops. No extremity edema. 2+ pedal pulses. No carotid bruits.  Abdomen: no tenderness, no masses palpated. No hepatosplenomegaly. Bowel sounds positive.  Musculoskeletal: no clubbing / cyanosis. No joint deformity upper and lower extremities. Good ROM, no contractures. Normal muscle tone.  Skin: no rashes, lesions, ulcers. No induration Neurologic: CN 2-12 grossly intact. Sensation intact, DTR normal. Strength 5/5 in all 4.  Psychiatric: Normal judgment and insight. Alert and oriented x 3. Normal mood.     Labs on Admission: I have personally reviewed following labs and imaging studies  CBC: Recent Labs  Lab 08/25/19 1937  WBC 8.8  NEUTROABS 7.7  HGB 12.0  HCT 37.9  MCV 94.3  PLT 233   Basic Metabolic Panel: Recent Labs  Lab 08/25/19 1937  NA 135  K 5.0  CL 102  CO2 20*  GLUCOSE 131*  BUN 18  CREATININE 1.53*  CALCIUM 10.1   GFR: CrCl cannot be calculated (Unknown ideal weight.). Liver Function Tests: Recent Labs  Lab 08/25/19 1937  AST 28  ALT 18  ALKPHOS 76  BILITOT 0.7  PROT 8.3*  ALBUMIN 4.0   No results for input(s): LIPASE, AMYLASE in the last 168 hours. No results for input(s): AMMONIA in the last 168 hours. Coagulation Profile: No results for input(s): INR, PROTIME in the last 168 hours. Cardiac Enzymes: No results  for input(s): CKTOTAL, CKMB, CKMBINDEX, TROPONINI in the last 168 hours. BNP (last 3 results) No results for input(s): PROBNP in the last 8760 hours. HbA1C: No results for input(s): HGBA1C in the last 72 hours. CBG: No results for input(s): GLUCAP in the last 168 hours. Lipid Profile: No results for input(s): CHOL, HDL, LDLCALC, TRIG, CHOLHDL, LDLDIRECT in the last 72 hours. Thyroid Function Tests: No results for input(s): TSH, T4TOTAL, FREET4, T3FREE, THYROIDAB in the last 72 hours. Anemia Panel: No results for input(s): VITAMINB12, FOLATE, FERRITIN, TIBC, IRON, RETICCTPCT in the last 72 hours. Urine analysis:    Component Value Date/Time   COLORURINE YELLOW 08/25/2019 2218   [place] 08/25/2019 2218   LABSPEC 1.018 08/25/2019 2218   PHURINE 5.0 08/25/2019 2218   GLUCOSEU NEGATIVE 08/25/2019 [place] NEGATIVE 08/25/2019 2218   BILIRUBINUR NEGATIVE 08/25/2019 2218   KETONESUR NEGATIVE 08/25/2019 2218   PROTEINUR NEGATIVE 08/25/2019 2218   UROBILINOGEN 1.0 10/09/2014 2005   NITRITE NEGATIVE 08/25/2019 2218   LEUKOCYTESUR NEGATIVE 08/25/2019 2218    Radiological Exams on Admission: Ct Head Wo Contrast  Result  Date: 08/25/2019 CLINICAL DATA:  Altered mental status. EXAM: CT HEAD WITHOUT CONTRAST TECHNIQUE: Contiguous axial images were obtained from the base of the skull through the vertex without intravenous contrast. COMPARISON:  01/19/2013 FINDINGS: Brain: No intracranial hemorrhage, mass effect, or midline shift. Brain volume is normal for age. Mild chronic small vessel ischemia. No hydrocephalus. The basilar cisterns are patent. No evidence of territorial infarct or acute ischemia. No extra-axial or intracranial fluid collection. Vascular: Atherosclerosis of skullbase vasculature without hyperdense vessel or abnormal calcification. Skull: No fracture or focal lesion. Sinuses/Orbits: Mild mucosal thickening of right maxillary sinus. No sinus fluid level. Mastoid air cells are clear. The visualized orbits are unremarkable. Bilateral cataract resection. Other: None. IMPRESSION: 1. No acute intracranial abnormality. 2. Mild chronic small vessel ischemia. Electronically Signed   By: Keith Rake M.D.   On: 08/25/2019 23:40   Ct Abdomen Pelvis W Contrast  Result Date: 08/26/2019 CLINICAL DATA:  Abdominal pain and fever. New onset urinary incontinence. EXAM: CT ABDOMEN AND PELVIS WITH CONTRAST TECHNIQUE: Multidetector CT imaging of the abdomen and pelvis was performed using the standard protocol following bolus administration of intravenous contrast. CONTRAST:  43mL OMNIPAQUE IOHEXOL 300 MG/ML  SOLN COMPARISON:  Noncontrast CT 10/10/2014 FINDINGS: Motion artifact through the lower chest and abdomen. Lower chest: Motion artifact through the lung bases. Mild cardiomegaly. Hepatobiliary: No focal liver abnormality is seen. No gallstones, gallbladder wall thickening, or biliary dilatation. Pancreas: No ductal dilatation or inflammation. Possible small tiny fat densities in the distal pancreatic tail which is similar to prior exam and likely incidental. Spleen: Normal in size without focal abnormality. Adrenals/Urinary  Tract: Normal adrenal glands. No hydronephrosis or perinephric edema. Homogeneous renal enhancement with symmetric excretion on delayed phase imaging. Motion artifact the lower kidneys limits assessment. Small cysts in the upper left kidney. Urinary bladder is partially distended without wall thickening. Stomach/Bowel: The stomach is decompressed limiting assessment. Detailed bowel evaluation is limited due to patient motion and lack of enteric contrast. Liquid stool in the cecum, ascending, and proximal transverse colon. No associated colonic wall thickening. No evidence of bowel inflammation. The appendix is not confidently visualized. No bowel obstruction. Vascular/Lymphatic: Aortic atherosclerosis. No aneurysm. Portal vein is patent. No bulky abdominopelvic adenopathy, motion limits assessment. Reproductive: Status post hysterectomy. No adnexal masses. Other: No evidence of free air. No free fluid. Musculoskeletal: Multilevel degenerative change in the spine.  There are no acute or suspicious osseous abnormalities. IMPRESSION: Liquid stool in the right and transverse colon, can be seen with diarrheal illness. No bowel inflammation. No other acute findings in the abdomen/pelvis, allowing for motion limited evaluation. Aortic Atherosclerosis (ICD10-I70.0). Electronically Signed   By: Narda Rutherford M.D.   On: 08/26/2019 00:08   Dg Chest Portable 1 View  Result Date: 08/25/2019 CLINICAL DATA:  Reason for exam: AMS and fever Pt [facility] for c/o of AMS and lethargy, staff unsure of when symptoms began Hx of HTN and Diabetes EXAM: PORTABLE CHEST 1 VIEW COMPARISON:  12/12/2013 FINDINGS: The heart size is enlarged and stable in configuration. There is atherosclerotic calcification of the thoracic aorta. Mild pulmonary vascular congestion is present. Suspect small RIGHT pleural effusion. No overt edema or consolidation. IMPRESSION: 1. Cardiomegaly and vascular congestion. 2. Suspect  small RIGHT pleural effusion. Electronically Signed   By: Norva Pavlov M.D.   On: 08/25/2019 20:21    EKG: Independently reviewed.  Assessment/Plan Principal Problem:   Febrile illness Active Problems:   Hypertension   Dementia (HCC)   Altered mental status   DM (diabetes mellitus) (HCC)    1. Febrile illness - 1. With diarrhea, cough, SOB, fever, and abd pain.  In a patient from facility. 2. High suspicion for COVID remains despite negative COVID test in ED. 3. Treating as CAP for the moment with rocephin / azithromycin, got cefepime / vanc in ED 4. MRSA PCR nares 5. Repeat COVID PCR testing 6. C.Diff and GI pathogen PNL pending 7. Tylenol PRN fever 8. Pro calcitonin pending 9. IVF: NS at 75 cc/hr 10. Will go ahead and check CRP / D.Dimer as well. 2. DM2 - 1. Hold home metformin 2. SSI AC mod scale 3. HTN - continue home BP meds once med rec completed, holding diuretics however 4. Dementia - chronic and stable, report of AMS earlier but none at the moment, presumably delirium secondary to underlying febrile illness, continue home meds  DVT prophylaxis: Lovenox Code Status: Full Family Communication: No family in room Disposition Plan: SNF after admit Consults called: None Admission status: Place in 49    ,  M. DO [facility]  How to contact the [facility] Attending or Consulting provider 7A - 7P or covering provider during after hours 7P -7A, for this patient?  1. Check the care team in [facility] and look for a) attending/consulting TRH provider listed and b) the [facility] team listed 2. Log into [email or web address]  Amion Physician Scheduling and messaging for groups and whole hospitals  On call and physician scheduling software for group practices, residents, hospitalists and other medical providers for call, clinic, rotation and shift schedules. On[employer] is a hospital-wide system for scheduling doctors and paging doctors on call. EasyPlot is for scientific  plotting and data analysis.  [email or web address]  and use [place]'s universal password to access. If you do not have the password, please contact the hospital operator.  3. Locate the [facility] provider you are looking for under [facility] and page to a number that you can be directly reached. 4. If you still have difficulty reaching the provider, please page the [facility] (Director on Call) for the Hospitalists listed on amion for assistance.  08/26/2019, 1:01 AM

## 2019-08-26 NOTE — Progress Notes (Signed)
The patient was admitted early this morning after midnight and H&P has been reviewed and I am in current agreement with assessment and plan done by Dr. Jennette Kettle.  Additional change of plan of care been made accordingly.  The patient is an 83 year old African-American female with a past medical history significant for but not limited to Alzheimer's disease, hypertension, diabetes mellitus type 2, hyperlipidemia and other comorbidities who was sent in from her skilled nursing facility for altered mental status.  AMS is improved but patient admits to having some shortness of breath, diarrhea cough as well as some abdominal pain.  It was unclear how long these were going on for.  T-max was 102.1 and chest x-ray showed some pulmonary vascular congestion and a questionable right pleural effusion.  CT of the abdomen pelvis done showed liquid stool in the right and transverse colon that could be seen with diarrheal illness with no bowel inflammation and no other acute findings in abdomen pelvis.  Patient was treated as a patient under interest and repeat COVID has been sent inflammatory markers were drawn and showed a CRP of 5.1, lactic acid level of 1.2, and procalcitonin level of 0.21, d-dimer of 4.85.  We will continue monitor and trend inflammatory markers and patient does have a febrile illness with diarrhea cough or shortness of breath and there is a strong suspicion for COVID 18 despite a negative cover test in the ED.  She is also being treated for a With Rocephin and azithromycin and she recently got Vanco cefepime in the ED.  Dr. Alcario Drought has ordered a C. difficile and GI pathogen panel which are still pending.  We will continue IV fluid hydration and renal function is improving.  We will follow-up studies and monitor patient's clinical response to intervention repeat blood work and imaging in the a.m.

## 2019-08-26 NOTE — ED Provider Notes (Signed)
Discussed with Dr. Alcario Drought for admission    Ripley Fraise, MD 08/26/19 [phone]

## 2019-08-26 NOTE — ED Notes (Signed)
Breakfast Ordered 

## 2019-08-27 DIAGNOSIS — R197 Diarrhea, unspecified: Secondary | ICD-10-CM

## 2019-08-27 DIAGNOSIS — E119 Type 2 diabetes mellitus without complications: Secondary | ICD-10-CM

## 2019-08-27 LAB — GASTROINTESTINAL PANEL BY PCR, STOOL (REPLACES STOOL CULTURE)

## 2019-08-27 LAB — CBC WITH DIFFERENTIAL/PLATELET
Abs Immature Granulocytes: 0.01 10*3/uL (ref 0.00–0.07)
Basophils Absolute: 0 10*3/uL (ref 0.0–0.1)
Basophils Relative: 0 %
Eosinophils Absolute: 0 10*3/uL (ref 0.0–0.5)
Eosinophils Relative: 0 %
HCT: 36.8 % (ref 36.0–46.0)
Hemoglobin: 11.8 g/dL — ABNORMAL LOW (ref 12.0–15.0)
Immature Granulocytes: 0 %
Lymphocytes Relative: 25 %
Lymphs Abs: 1.5 10*3/uL (ref 0.7–4.0)
MCH: 29.6 pg (ref 26.0–34.0)
MCHC: 32.1 g/dL (ref 30.0–36.0)
MCV: 92.2 fL (ref 80.0–100.0)
Monocytes Absolute: 0.8 10*3/uL (ref 0.1–1.0)
Monocytes Relative: 14 %
Neutro Abs: 3.6 10*3/uL (ref 1.7–7.7)
Neutrophils Relative %: 61 %
Platelets: 206 10*3/uL (ref 150–400)
RBC: 3.99 MIL/uL (ref 3.87–5.11)
RDW: 13.1 % (ref 11.5–15.5)
WBC: 5.9 10*3/uL (ref 4.0–10.5)
nRBC: 0 % (ref 0.0–0.2)

## 2019-08-27 LAB — LACTATE DEHYDROGENASE: LDH: 158 U/L (ref 98–192)

## 2019-08-27 LAB — PHOSPHORUS: Phosphorus: 3.7 mg/dL (ref 2.5–4.6)

## 2019-08-27 LAB — GLUCOSE, CAPILLARY
Glucose-Capillary: 92 mg/dL (ref 70–99)
Glucose-Capillary: 112 mg/dL — ABNORMAL HIGH (ref 70–99)
Glucose-Capillary: 149 mg/dL — ABNORMAL HIGH (ref 70–99)
Glucose-Capillary: 106 mg/dL — ABNORMAL HIGH (ref 70–99)

## 2019-08-27 LAB — COMPREHENSIVE METABOLIC PANEL
ALT: 16 U/L (ref 0–44)
AST: 24 U/L (ref 15–41)
Albumin: 3.2 g/dL — ABNORMAL LOW (ref 3.5–5.0)
Alkaline Phosphatase: 69 U/L (ref 38–126)
Anion gap: 14 (ref 5–15)
BUN: 14 mg/dL (ref 8–23)
CO2: 20 mmol/L — ABNORMAL LOW (ref 22–32)
Calcium: 9 mg/dL (ref 8.9–10.3)
Chloride: 100 mmol/L (ref 98–111)
Creatinine, Ser: 1.27 mg/dL — ABNORMAL HIGH (ref 0.44–1.00)
GFR calc Af Amer: 46 mL/min — ABNORMAL LOW (ref >60)
GFR calc non Af Amer: 40 mL/min — ABNORMAL LOW (ref >60)
Glucose, Bld: 104 mg/dL — ABNORMAL HIGH (ref 70–99)
Potassium: 3.8 mmol/L (ref 3.5–5.1)
Sodium: 134 mmol/L — ABNORMAL LOW (ref 135–145)
Total Bilirubin: 0.8 mg/dL (ref 0.3–1.2)
Total Protein: 7.4 g/dL (ref 6.5–8.1)

## 2019-08-27 LAB — TRIGLYCERIDES: Triglycerides: 123 mg/dL (ref <150)

## 2019-08-27 LAB — CK: Total CK: 349 U/L — ABNORMAL HIGH (ref 38–234)

## 2019-08-27 LAB — MRSA PCR SCREENING: MRSA by PCR: NEGATIVE

## 2019-08-27 LAB — D-DIMER, QUANTITATIVE: D-Dimer, Quant: 2.6 ug/mL-FEU — ABNORMAL HIGH (ref 0.00–0.50)

## 2019-08-27 LAB — FIBRINOGEN: Fibrinogen: 366 mg/dL (ref 210–475)

## 2019-08-27 LAB — MAGNESIUM: Magnesium: 1.3 mg/dL — ABNORMAL LOW (ref 1.7–2.4)

## 2019-08-27 LAB — C-REACTIVE PROTEIN: CRP: 6.7 mg/dL — ABNORMAL HIGH (ref <1.0)

## 2019-08-27 LAB — SEDIMENTATION RATE: Sed Rate: 34 mm/hr — ABNORMAL HIGH (ref 0–22)

## 2019-08-27 MED ORDER — MAGNESIUM OXIDE 400 (241.3 MG) MG PO TABS
400.0000 mg | ORAL_TABLET | Freq: Two times a day (BID) | ORAL | Status: AC
  Administered 2019-08-27 – 2019-08-28 (×3): 400 mg via ORAL
  Filled 2019-08-27 (×3): qty 1

## 2019-08-27 NOTE — Progress Notes (Signed)
PROGRESS NOTE  Shawn StallRosebud Sandford [id number] DOB: 01-31-1936 DOA: 08/25/2019 PCP: Ron ParkerBowen, Samuel, MD  Brief History   83 year old woman PMH dementia, diabetes mellitus, hypertension, presented [place] [facility] with report of confusion, shortness of breath, diarrhea, cough, abdominal pain of unclear duration given dementia.  Family febrile in the emergency department.  CT abdomen pelvis showed diarrheal illness.  COVID was negative.  Admitted for diarrhea, febrile illness.  A & P  Diarrhea, fever, reported shortness of breath, cough, abdominal pain, possible pneumonia.  COVID negative x2.  D-dimer decreased, of unclear significance.  LDH within normal limits.  CRP elevated, again of unclear significance.  Lactic acid within normal limits.  Procalcitonin is low.  GI pathogen panel negative. --Unreliable history has complicated investigation and differential diagnosis.  At this point diarrhea has resolved and C. difficile is negative.  Afebrile greater than 24 hours.  No hypoxia.  Chest x-ray with no infiltrates noted.  Inflammatory markers mixed but overall improving. --Do not see any signs or symptoms to suggest COVID at this time.  Given 2- tests and the above, DC airborne precautions. --Do not see any indication for antibiotics at this time, stop and watch.  Acute encephalopathy reported prior to admission, resolved per admitting physician's note --Suspect she is at baseline.  Alert, interactive.  Hypomagnesemia --Replete.  Diabetes mellitus type 2.  Hemoglobin A1c 6.5. --Blood sugars stable.  Essential hypertension --Stable.  Alzheimer's dementia --Appears stable.  Suspect she is at baseline.  Continue Lexapro, Pradaxa, lorazepam.  . Overall seems to be improving.  Will monitor.  Resolved Hospital Problem list       DVT prophylaxis: enoxaparin Code Status: Full Family Communication: none at bedside [facility]    Jinan Biggins, MD   [facility] Direct contact: see [email or web address] (further directions at bottom of note if needed) 7PM-7AM contact night coverage as at bottom of note 08/27/2019, 5:52 PM  LOS: 1 day   Significant Hospital Events   . 8/31 admitted for diarrhea, fever   Consults:  .    Procedures:  .   Significant Diagnostic Tests:  . 8/31 chest x-ray cardiomegaly, vascular congestion . 8/31 CT abdomen pelvis liquid stool in the right and transverse colon as can be seen with diarrheal illness.  No inflammation.  Motion artifact through the lung bases. . CT head no acute abnormalities. . EKG sinus rhythm, no acute changes   Micro Data:  . SARS-CoV-2 -8/31, 9/1 . 8/31 blood cultures no growth thus far . Urine culture insignificant growth   Antimicrobials:  .   Interval History/Subjective  Patient feels well, she reports some pain at times and some shortness of breath at times.  Her history does not appear to be accurate.  Discussed with bedside nurse, the patient is done very well, no concerns noted.  No diarrhea today.  No noted shortness of breath.  Objective   Vitals:  Vitals:   08/27/19 1142 08/27/19 1534  BP: 101/66 137/63  Pulse: 64 (!) 57  Resp: 16 16  Temp: 98.2 F (36.8 C) 97.6 F (36.4 C)  SpO2: 99% 96%    Exam:  Constitutional:  . Appears calm and comfortable on room air Eyes:  Marland Kitchen. Eyes appear grossly normal ENMT:  . grossly normal hearing  . Lips appear normal Respiratory:  . CTA bilaterally, no w/r/r.  . Respiratory effort normal.  Cardiovascular:  . RRR, no m/r/g . No LE extremity edema   . Telemetry sinus rhythm Abdomen:  .  Soft, nontender, nondistended Skin:  . No rashes noted Psychiatric:  . Mental status o Mood, affect appropriate  I have personally reviewed the following:   Today's Data  . BMP unremarkable.  Creatinine near normal at 1.27.  BUN within normal limits.  Anion gap within normal limits.  Magnesium low at 1.3.  LFTs unremarkable. Marland Kitchen LDH  within normal limits . CBC within normal limits . Fibrinogen within normal limits . C. difficile negative  Scheduled Meds: . amLODipine  10 mg Oral q morning - 10a  . enoxaparin (LOVENOX) injection  40 mg Subcutaneous Daily  . escitalopram  10 mg Oral Daily  . insulin aspart  0-15 Units Subcutaneous TID WC  . insulin aspart  0-5 Units Subcutaneous QHS  . LORazepam  1 mg Oral QHS  . OLANZapine zydis  10 mg Oral QHS  . pantoprazole  20 mg Oral Daily  . traMADol  50 mg Oral BID   Continuous Infusions:   Principal Problem:   Febrile illness Active Problems:   Hypertension   Dementia (HCC)   DM (diabetes mellitus) ([place])   Diarrhea   Hypomagnesemia   LOS: 1 day   How to contact the [facility] Attending or Consulting provider 7A - 7P or covering provider during after hours [place], for this patient?  1. Check the care team in [facility] and look for a) attending/consulting TRH provider listed and b) the [facility] team listed 2. Log into [email or web address] and use [place]'s universal password to access. If you do not have the password, please contact the hospital operator. 3. Locate the [facility] provider you are looking for under [facility] and page to a number that you can be directly reached. 4. If you still have difficulty reaching the provider, please page the [facility] (Director on Call) for the Hospitalists listed on amion for assistance.

## 2019-08-27 NOTE — Progress Notes (Signed)
Daily Nursing Note Received report from [place], [place].   Introduced self to patient.   Patient endorses no significant concerns, states she needs to use the restroom at beginning of shift.  Able to mobilize with FFW  mIVF NS @ 68m/hr  (+) small bowel movement x2 on bristol chart a four, brown, no straining noted  Excellent appetite, hydrating well orally  OOB in chair for 7 hours  MEWS Scores has remained GREEN throughout the shift   All patient needs met throughout the  Course of the shift  MFreada BergeronFerolito 08/27/2019,10:31 AM

## 2019-08-28 LAB — GLUCOSE, CAPILLARY
Glucose-Capillary: 90 mg/dL (ref 70–99)
Glucose-Capillary: 209 mg/dL — ABNORMAL HIGH (ref 70–99)
Glucose-Capillary: 101 mg/dL — ABNORMAL HIGH (ref 70–99)
Glucose-Capillary: 123 mg/dL — ABNORMAL HIGH (ref 70–99)

## 2019-08-28 LAB — SARS CORONAVIRUS 2 (TAT 6-24 HRS): SARS Coronavirus 2: NEGATIVE

## 2019-08-28 NOTE — Progress Notes (Signed)
PROGRESS NOTE  Katrina Burton:[id number]:[id number] DOB: 1936-09-11 DOA: 08/25/2019 PCP: Ron ParkerBowen, Samuel, MD  Brief History   83 year old woman PMH dementia, diabetes mellitus, hypertension, presented from [facility] with report of confusion, shortness of breath, diarrhea, cough, abdominal pain of unclear duration given dementia.  Family febrile in the emergency department.  CT abdomen pelvis showed diarrheal illness.  COVID was negative.  Admitted for diarrhea, febrile illness.  A & P  Diarrhea, fever, reported shortness of breath, cough, abdominal pain, possible pneumonia.  COVID negative x2.  D-dimer decreased, of unclear significance.  LDH within normal limits.  CRP elevated, again of unclear significance.  Lactic acid within normal limits.  Procalcitonin is low.  GI pathogen panel negative.  C. difficile negative. --No further issues noted.  Tolerating diet.  No diarrhea. --Remains afebrile, no hypoxia.  Chest x-ray with no infiltrates. --No signs or symptoms of COVID.  COVID not suspected.  Acute encephalopathy reported prior to admission, resolved per admitting physician's note --Remains alert and interactive, suspect she is at baseline  Hypomagnesemia --Repleted.  Diabetes mellitus type 2.  Hemoglobin A1c 6.5. --Blood sugars stable.  Essential hypertension --Stable.  Alzheimer's dementia --Appears stable.  Suspect she is at baseline.  Continue Lexapro, olanzapine, lorazepam.  . She has been improving.  No fever.  Monitor today.  Return to skilled nursing facility 9/4 if improving.  Repeat COVID test, obtained today.  Resolved Hospital Problem list       DVT prophylaxis: enoxaparin Code Status: Full Family Communication: none at bedside [facility]     , MD  [facility] Direct contact: see [email or web address] (further directions at bottom of note if needed) 7PM-7AM contact night coverage as at bottom of note 08/28/2019, 12:15  PM  LOS: 2 days   Significant Hospital Events   . 8/31 admitted for diarrhea, fever   Consults:  . None   Procedures:  . None  Significant Diagnostic Tests:  . 8/31 chest x-ray cardiomegaly, vascular congestion . 8/31 CT abdomen pelvis liquid stool in the right and transverse colon as can be seen with diarrheal illness.  No inflammation.  Motion artifact through the lung bases. . CT head no acute abnormalities. . EKG sinus rhythm, no acute changes   Micro Data:  . SARS-CoV-2 -8/31, 9/1 . 8/31 blood cultures no growth thus far . Urine culture insignificant growth   Antimicrobials:  .   Interval History/Subjective  No issues overnight per nursing.  Patient seems to feel well.  History unreliable.  Objective   Vitals:  Vitals:   08/27/19 2305 08/28/19 0744  BP: 139/69 116/72  Pulse: (!) 57 (!) 57  Resp: 20 18  Temp: 98.4 F (36.9 C) 98.1 F (36.7 C)  SpO2: 96% 97%    Exam:  Constitutional:   . Appears calm and comfortable Respiratory:  . CTA bilaterally, no w/r/r.  . Respiratory effort normal. Cardiovascular:  . RRR, no m/r/g . No LE extremity edema   Abdomen:  . Soft.  Skin appears unremarkable. Psychiatric:  . Mental status o Pleasant, confused  I have personally reviewed the following:   Today's Data  . CBG stable  Scheduled Meds: . amLODipine  10 mg Oral q morning - 10a  . enoxaparin (LOVENOX) injection  40 mg Subcutaneous Daily  . escitalopram  10 mg Oral Daily  . insulin aspart  0-15 Units Subcutaneous TID WC  . insulin aspart  0-5 Units Subcutaneous QHS  . LORazepam  1 mg Oral QHS  .  magnesium oxide  400 mg Oral BID  . OLANZapine zydis  10 mg Oral QHS  . pantoprazole  20 mg Oral Daily  . traMADol  50 mg Oral BID   Continuous Infusions:   Principal Problem:   Febrile illness Active Problems:   Hypertension   Dementia (HCC)   DM (diabetes mellitus) ([place])   Diarrhea   Hypomagnesemia   LOS: 2 days   How to contact the [facility]  Attending or Consulting provider [place] or covering provider during after hours [place], for this patient?  1. Check the care team in [facility] and look for a) attending/consulting TRH provider listed and b) the [facility] team listed 2. Log into [email or web address] and use [facility]'s universal password to access. If you do not have the password, please contact the hospital operator. 3. Locate the [facility] provider you are looking for under [facility] and page to a number that you can be directly reached. 4. If you still have difficulty reaching the provider, please page the [facility] (Director on Call) for the Hospitalists listed on amion for assistance.

## 2019-08-28 NOTE — Plan of Care (Signed)
  Problem: Clinical Measurements: Goal: Respiratory complications will improve Outcome: Progressing   Problem: Coping: Goal: Level of anxiety will decrease Outcome: Progressing   Problem: Pain Managment: Goal: General experience of comfort will improve Outcome: Progressing   Problem: Education: Goal: Knowledge of General Education information will improve Description: Including pain rating scale, medication(s)/side effects and non-pharmacologic comfort measures Outcome: Adequate for Discharge   Problem: Health Behavior/Discharge Planning: Goal: Ability to manage health-related needs will improve Outcome: Adequate for Discharge

## 2019-08-28 NOTE — TOC Initial Note (Signed)
Transition of Care [facility]) - Initial/Assessment Note    Patient Details  Name: Katrina Burton MRN: [id number] Date of Birth: 1936-12-13  Transition [facility] CM/SW Contact:    Geralynn Ochs, LCSW Phone Number: 08/28/2019, 11:37 AM  Clinical Narrative:    CSW following for discharge plan. CSW attempted to contact patient's daughter, Enid Derry, to discuss patient's return back to [facility], but the phone rang with no pick up and no voicemail. CSW to attempt again. CSW reached out to [place] to discuss patient's return, likely tomorrow, and [facility] will need a new COVID test for her; it needs to be within 48 hours of admission. CSW alerted MD. CSW to follow.               Expected Discharge Plan: Memory Care Barriers to Discharge: Continued Medical Work up   Patient Goals and CMS Choice Patient states their goals for this hospitalization and ongoing recovery are:: patient unable to state goals due to dementia      Expected Discharge Plan and Services Expected Discharge Plan: Memory Care     Post Acute Care Choice: Nursing Home Living arrangements for the past 2 months: [place]                                      Prior Living Arrangements/Services Living arrangements for the past 2 months: [place] Lives with:: Facility Resident Patient language and need for interpreter reviewed:: No        Need for Family Participation in Patient Care: Yes (Comment) Care giver support system in place?: Yes (comment)   Criminal Activity/Legal Involvement Pertinent to Current Situation/Hospitalization: No - Comment as needed  Activities of Daily Living Home Assistive Devices/Equipment: Gilford Rile (specify type) ADL Screening (condition at time of admission) Patient's cognitive ability adequate to safely complete daily activities?: No Is the patient deaf or have difficulty hearing?: No Does the patient have difficulty seeing,  even when wearing glasses/contacts?: No Does the patient have difficulty concentrating, remembering, or making decisions?: Yes Patient able to express need for assistance with ADLs?: Yes Does the patient have difficulty dressing or bathing?: No Independently performs ADLs?: Yes (appropriate for developmental age) Does the patient have difficulty walking or climbing stairs?: Yes Weakness of Legs: Both Weakness of Arms/Hands: None  Permission Sought/Granted Permission sought to share information with : Facility Sport and exercise psychologist, Family Supports Permission granted to share information with : Yes, Verbal Permission Granted  Share Information with NAME: Enid Derry  Permission granted to share info w AGENCY: [place] granted to share info w Relationship: Daughter     Emotional Assessment Appearance:: Appears stated age Attitude/Demeanor/Rapport: Unable to Assess Affect (typically observed): Unable to Assess Orientation: : Oriented to Self, Oriented to Place Alcohol / Substance Use: Not Applicable Psych Involvement: No (comment)  Admission diagnosis:  Transient alteration of awareness [R40.4] Pleural effusion on right [J90] Acute febrile illness [R50.9] Patient Active Problem List   Diagnosis Date Noted  . Diarrhea 08/27/2019  . Hypomagnesemia 08/27/2019  . Febrile illness 08/26/2019  . DKA (diabetic ketoacidoses) ([place]) 12/12/2013  . Hyponatremia 12/12/2013  . Acute kidney injury ([place]) 12/12/2013  . UTI (lower urinary tract infection) 04/29/2013  . DM (diabetes mellitus) ([place]) 04/29/2013  . Hypertension   . Dementia [facility]    PCP:  Sande Brothers, MD Pharmacy:  No Pharmacies Listed    Social Determinants  of Health (SDOH) Interventions    Readmission Risk Interventions No flowsheet data found.  

## 2019-08-29 LAB — URINALYSIS, ROUTINE W REFLEX MICROSCOPIC
Bilirubin Urine: NEGATIVE
Glucose, UA: NEGATIVE mg/dL
Hgb urine dipstick: NEGATIVE
Ketones, ur: NEGATIVE mg/dL
Leukocytes,Ua: NEGATIVE
Nitrite: NEGATIVE
Protein, ur: NEGATIVE mg/dL
Specific Gravity, Urine: 1.006 (ref 1.005–1.030)
pH: 6 (ref 5.0–8.0)

## 2019-08-29 LAB — GLUCOSE, CAPILLARY
Glucose-Capillary: 96 mg/dL (ref 70–99)
Glucose-Capillary: 127 mg/dL — ABNORMAL HIGH (ref 70–99)
Glucose-Capillary: 122 mg/dL — ABNORMAL HIGH (ref 70–99)
Glucose-Capillary: 151 mg/dL — ABNORMAL HIGH (ref 70–99)

## 2019-08-29 MED ORDER — LORAZEPAM 0.5 MG PO TABS: 1.0000 mg | ORAL_TABLET | Freq: Every day | ORAL | 0 refills | Status: AC

## 2019-08-29 MED ORDER — TRAMADOL HCL 50 MG PO TABS: 50.0000 mg | ORAL_TABLET | Freq: Two times a day (BID) | ORAL | 0 refills | Status: DC

## 2019-08-29 NOTE — Progress Notes (Signed)
Attempted to call report 3 times with no answer. Will try again.

## 2019-08-29 NOTE — Progress Notes (Signed)
Report given to receiving facility.

## 2019-08-29 NOTE — TOC Transition Note (Signed)
Transition of Care [facility]) - CM/SW Discharge Note   Patient Details  Name: Katrina Burton MRN: [id number] Date of Birth: 25-Feb-1936  Transition of Care [facility]) CM/SW Contact:  Geralynn Ochs, LCSW Phone Number: 08/29/2019, 3:06 PM   Clinical Narrative:   Nurse to call report to [phone], Press 3 and ask to speak to her nurse in [place].    Final next level of care: Memory Care Barriers to Discharge: Barriers Resolved   Patient Goals and CMS Choice Patient states their goals for this hospitalization and ongoing recovery are:: patient unable to state goals due to dementia      Discharge Placement              Patient [facility] bed at: [facility] Patient to be transferred to facility by: [place] Name of family member notified: Self, attempted daughter Patient and family notified of of transfer: 08/29/19  Discharge Plan and Services     Post Acute Care Choice: Nursing Home                               Social Determinants of Health (SDOH) Interventions     Readmission Risk Interventions No flowsheet data found.

## 2019-08-29 NOTE — Care Management Important Message (Signed)
Important Message  Patient Details  Name: Katrina Burton MRN: [id number] Date of Birth: 10-16-1936   Medicare Important Message Given:  Yes     [name] [address] 08/29/2019, 4:37 PM

## 2019-08-29 NOTE — Discharge Summary (Signed)
Physician Discharge Summary  Katrina Burton URK:[id number] DOB: [birth year] DOA: 08/25/2019  PCP: Sande Brothers, MD  Admit date: 08/25/2019 Discharge date: 08/29/2019  Recommendations for Outpatient Follow-up:  1. Follow-up urine culture, dysuria. UC ordered 9/4. UC from 8/31 was insignificant. 2. Bactrim was discontinued as no clear indication.  I was unable to reach any staff at [facility] facility despite calling and being placed on hold.  If the patient has been on this medication long-term or for another compelling reason, would defer to attending physician or primary care physician on this. 3. Repeat urinalysis and urine culture on repeat pending. 4. Routine care  Follow-up Information    Sande Brothers, MD. Schedule an appointment as soon as possible for a visit in 1 week(s).   Specialty: Internal Medicine Contact information: [address] [place] [address] [phone]            Discharge Diagnoses: Principal diagnosis is #1 1. Diarrhea, fever, reported shortness of breath, cough, abdominal pain, pneumonia ruled out 2. Acute encephalopathy reported prior to admission 3. Hypomagnesemia 4. Diabetes mellitus type 2 5. Essential hypertension 6. Alzheimer's dementia  Discharge Condition: improved Disposition: return to [place] recommendation: diabetic diet  Filed Weights   08/25/19 1948 08/26/19 1301  Weight: 77 kg 85.8 kg    History of present illness:  83 year old woman PMH dementia, diabetes mellitus, hypertension, presented from [place] with report of confusion, shortness of breath, diarrhea, cough, abdominal pain of unclear duration given dementia.  Family febrile in the emergency department.  CT abdomen pelvis showed diarrheal illness.  COVID was negative.  Admitted for diarrhea, febrile illness.  Hospital Course:  Patient was observed, she had no further fever, shortness of breath, notable cough, abdominal pain, signs or  symptoms of infection.  COVID was negative x2.  Infectious work-up was negative including negative GI panel and C. difficile.  Tolerating diet.  She did complain of some burning with urination, external exam was unremarkable.  Initial urinalysis on admission was negative and culture was unrevealing.  We will repeat a urinalysis but she remains afebrile and appears clinically well and appears stable for discharge.  Hospitalization was uncomplicated.  Individual issues as below.  I was unable to reach daughter at home at [phone].  Diarrhea, fever, reported shortness of breath, cough, abdominal pain, pneumonia ruled out.  COVID negative x2.  D-dimer decreased, of unclear significance.  LDH within normal limits.  CRP elevated, again of unclear significance.  Lactic acid within normal limits.  Procalcitonin low.  GI pathogen panel negative.  C. difficile negative. --She has had no diarrhea since admission, no fever.  Tolerating diet and appears well.  Remains afebrile, no hypoxia.  Chest x-ray with no acute disease.   --No signs or symptoms of COVID.  COVID is not suspected.  Acute encephalopathy reported prior to admission, resolved per admitting physician's note --She appears to be at her baseline and does answer some questions appropriately.  Hypomagnesemia --Repleted.  Diabetes mellitus type 2.  Hemoglobin A1c 6.5. --Blood sugars stable.  Resume metformin on discharge.  Essential hypertension --Remained stable.  Alzheimer's dementia --Appears  stable.  Suspect she is at baseline.    Continue Lexapro, olanzapine, lorazepam.  Significant Hospital Events    8/31 admitted for diarrhea, fever  Consults:   None  Procedures:   None  Significant Diagnostic Tests:   8/31 chest x-ray cardiomegaly, vascular congestion  8/31 CT abdomen pelvis liquid stool in the right and transverse colon as can  be seen with diarrheal illness.  No inflammation.  Motion artifact through the lung  bases.  CT head no acute abnormalities.  EKG sinus rhythm, no acute changes  Micro Data:   SARS-CoV-2 -8/31, 9/1  8/31 blood cultures no growth thus far  Urine culture insignificant growth  Today's assessment: S: Discussed with nursing, no issues overnight, however patient was complaining of burning with urination yesterday and again today.  Stool formed.  No diarrhea.  Patient spontaneously reports pain and burning with urination.  Otherwise seems to feel well. O: Vitals:  Vitals:   08/28/19 2323 08/29/19 0752  BP: 136/65 111/66  Pulse: (!) 50 (!) 50  Resp: 16   Temp: 97.8 F (36.6 C) 98.8 F (37.1 C)  SpO2: 95% 99%    Constitutional:   Appears calm and comfortable lying in bed Eyes:   pupils and irises appear grossly normal ENMT:   grossly normal hearing   Lips appear normal Respiratory:   CTA bilaterally, no w/r/r.   Respiratory effort normal.  Cardiovascular:   RRR, no m/r/g Abdomen:   Soft, nontender, nondistended GU: Entire examination conducted with Venita Sheffield RN at bedside as chaperone: Mons appears unremarkable, external genitalia appear unremarkable.  There is no discharge.  No lesions are noted. Skin:   No rashes, lesions, ulcers noted over the groin. Psychiatric:   Mental status o Mood, affect appropriate o Answer simple questions  Urinalysis pending Initial urinalysis 8/31 was unremarkable and urine culture from 8/31 had insignificant growth   Discharge Instructions  Discharge Instructions    Discharge instructions   Complete by: As directed    Call your physician or seek immediate medical attention for fever, pain, confusion or worsening of condition.  Usual diet.     Allergies as of 08/29/2019      Reactions   Bumetanide Other (See Comments)   unknown   Lasix [furosemide] Hives   Lipitor [atorvastatin] Hives   Other Other (See Comments)   Etacrynic acid unknown   Soap Other (See Comments)   AGENT:Certain types of soap    Statins Other (See Comments)   Hmg -Coa Reductase inhibitors   Torsemide Other (See Comments)   Unknown      Medication List    STOP taking these medications   sulfamethoxazole-trimethoprim 800-160 MG tablet Commonly known as: BACTRIM DS     TAKE these medications   acetaminophen 500 MG tablet Commonly known as: TYLENOL Take 500 mg by mouth every 4 (four) hours as needed for fever or headache.   alum & mag hydroxide-simeth 200-200-20 MG/5ML suspension Commonly known as: MAALOX/MYLANTA Take 30 mLs by mouth every 6 (six) hours as needed for indigestion or heartburn.   amLODipine 10 MG tablet Commonly known as: NORVASC Take 10 mg by mouth every morning.   BAZA PROTECT EX Apply 1 application topically See admin instructions. Every shift   Minerin Creme Crea Apply 1 application topically 2 (two) times daily. 06AM and 1800 Apply to total body   cetirizine 10 MG tablet Commonly known as: ZYRTEC Take 10 mg by mouth daily as needed for allergies.   escitalopram 10 MG tablet Commonly known as: LEXAPRO Take 10 mg by mouth daily.   guaifenesin 100 MG/5ML syrup Commonly known as: ROBITUSSIN Take 200 mg by mouth every 6 (six) hours as needed for cough.   insulin aspart 100 UNIT/ML injection Commonly known as: novoLOG Inject 6 Units into the skin as needed for high blood sugar. Sliding scale BS 0.350 = 0 unitrs  If blood sugar is greater than 350 give 6 units FSBS every Monday, Wednesday and Friday   loperamide 2 MG capsule Commonly known as: IMODIUM Take 2 mg by mouth as needed for diarrhea or loose stools. For diarrhea   LORazepam 0.5 MG tablet Commonly known as: ATIVAN Take 2 tablets (1 mg total) by mouth at bedtime.   magnesium hydroxide 400 MG/5ML suspension Commonly known as: MILK OF MAGNESIA Take 30 mLs by mouth at bedtime as needed for mild constipation.   metFORMIN 1000 MG tablet Commonly known as: GLUCOPHAGE Take 1,000 mg by mouth 2 (two) times daily with a  meal.   nystatin powder Generic drug: nystatin Apply 1 Bottle topically daily as needed (Apply to Groin).   OLANZapine zydis 10 MG disintegrating tablet Commonly known as: ZYPREXA Take 10 mg by mouth at bedtime.   olopatadine 0.1 % ophthalmic solution Commonly known as: PATANOL Place 1 drop into both eyes daily as needed for allergies.   OPTIVE 0.5-0.9 % ophthalmic solution Generic drug: carboxymethylcellul-glycerin Place 1 drop into both eyes 2 (two) times daily.   pantoprazole 20 MG tablet Commonly known as: PROTONIX Take 20 mg by mouth daily.   spironolactone 25 MG tablet Commonly known as: ALDACTONE Take 25 mg by mouth daily.   traMADol 50 MG tablet Commonly known as: ULTRAM Take 1 tablet (50 mg total) by mouth 2 (two) times daily. Foot pain.   Triple Antibiotic [phone] Oint Apply 1 application topically as needed (Minor skin tears or abrasions).   Vitamin D3 50 MCG (2000 UT) Tabs Take 2,000 Units by mouth daily.      Allergies  Allergen Reactions   Bumetanide Other (See Comments)    unknown   Lasix [Furosemide] Hives   Lipitor [Atorvastatin] Hives   Other Other (See Comments)    Etacrynic acid unknown   Soap Other (See Comments)    AGENT:Certain types of soap   Statins Other (See Comments)    Hmg -Coa Reductase inhibitors   Torsemide Other (See Comments)    Unknown    The results of significant diagnostics from this hospitalization (including imaging, microbiology, ancillary and laboratory) are listed below for reference.    Significant Diagnostic Studies: Ct Head Wo Contrast  Result Date: 08/25/2019 CLINICAL DATA:  Altered mental status. EXAM: CT HEAD WITHOUT CONTRAST TECHNIQUE: Contiguous axial images were obtained from the base of the skull through the vertex without intravenous contrast. COMPARISON:  01/19/2013 FINDINGS: Brain: No intracranial hemorrhage, mass effect, or midline shift. Brain volume is normal for age. Mild chronic small  vessel ischemia. No hydrocephalus. The basilar cisterns are patent. No evidence of territorial infarct or acute ischemia. No extra-axial or intracranial fluid collection. Vascular: Atherosclerosis of skullbase vasculature without hyperdense vessel or abnormal calcification. Skull: No fracture or focal lesion. Sinuses/Orbits: Mild mucosal thickening of right maxillary sinus. No sinus fluid level. Mastoid air cells are clear. The visualized orbits are unremarkable. Bilateral cataract resection. Other: None. IMPRESSION: 1. No acute intracranial abnormality. 2. Mild chronic small vessel ischemia. Electronically Signed   By: Narda Rutherford M.D.   On: 08/25/2019 23:40   Ct Abdomen Pelvis W Contrast  Result Date: 08/26/2019 CLINICAL DATA:  Abdominal pain and fever. New onset urinary incontinence. EXAM: CT ABDOMEN AND PELVIS WITH CONTRAST TECHNIQUE: Multidetector CT imaging of the abdomen and pelvis was performed using the standard protocol following bolus administration of intravenous contrast. CONTRAST:  80mL OMNIPAQUE IOHEXOL 300 MG/ML  SOLN COMPARISON:  Noncontrast CT 10/10/2014 FINDINGS: Motion artifact through  the lower chest and abdomen. Lower chest: Motion artifact through the lung bases. Mild cardiomegaly. Hepatobiliary: No focal liver abnormality is seen. No gallstones, gallbladder wall thickening, or biliary dilatation. Pancreas: No ductal dilatation or inflammation. Possible small tiny fat densities in the distal pancreatic tail which is similar to prior exam and likely incidental. Spleen: Normal in size without focal abnormality. Adrenals/Urinary Tract: Normal adrenal glands. No hydronephrosis or perinephric edema. Homogeneous renal enhancement with symmetric excretion on delayed phase imaging. Motion artifact the lower kidneys limits assessment. Small cysts in the upper left kidney. Urinary bladder is partially distended without wall thickening. Stomach/Bowel: The stomach is decompressed limiting  assessment. Detailed bowel evaluation is limited due to patient motion and lack of enteric contrast. Liquid stool in the cecum, ascending, and proximal transverse colon. No associated colonic wall thickening. No evidence of bowel inflammation. The appendix is not confidently visualized. No bowel obstruction. Vascular/Lymphatic: Aortic atherosclerosis. No aneurysm. Portal vein is patent. No bulky abdominopelvic adenopathy, motion limits assessment. Reproductive: Status post hysterectomy. No adnexal masses. Other: No evidence of free air. No free fluid. Musculoskeletal: Multilevel degenerative change in the spine. There are no acute or suspicious osseous abnormalities. IMPRESSION: Liquid stool in the right and transverse colon, can be seen with diarrheal illness. No bowel inflammation. No other acute findings in the abdomen/pelvis, allowing for motion limited evaluation. Aortic Atherosclerosis (ICD10-I70.0). Electronically Signed   By: Narda Rutherford M.D.   On: 08/26/2019 00:08   Dg Chest Portable 1 View  Result Date: 08/25/2019 CLINICAL DATA:  Reason for exam: AMS and fever Pt arrives via [facility] for c/o of AMS and lethargy, staff unsure of when symptoms began Hx of HTN and Diabetes EXAM: PORTABLE CHEST 1 VIEW COMPARISON:  12/12/2013 FINDINGS: The heart size is enlarged and stable in configuration. There is atherosclerotic calcification of the thoracic aorta. Mild pulmonary vascular congestion is present. Suspect small RIGHT pleural effusion. No overt edema or consolidation. IMPRESSION: 1. Cardiomegaly and vascular congestion. 2. Suspect small RIGHT pleural effusion. Electronically Signed   By: Norva Pavlov M.D.   On: 08/25/2019 20:21    Microbiology: Recent Results (from the past 240 hour(s))  Culture, blood (Routine x 2)     Status: None (Preliminary result)   Collection Time: 08/25/19  8:30 PM   Specimen: BLOOD LEFT ARM  Result Value Ref Range Status   Specimen Description  BLOOD LEFT ARM  Final   Special Requests   Final    BOTTLES DRAWN AEROBIC AND ANAEROBIC Blood Culture results may not be optimal due to an inadequate volume of blood received in culture bottles   Culture   Final    NO GROWTH 3 DAYS Performed at [facility], [address] [place], [place] [address]    Report Status PENDING  Incomplete  Culture, blood (Routine x 2)     Status: None (Preliminary result)   Collection Time: 08/25/19  8:55 PM   Specimen: BLOOD  Result Value Ref Range Status   Specimen Description BLOOD RIGHT ANTECUBITAL  Final   Special Requests   Final    AEROBIC BOTTLE ONLY Blood Culture results may not be optimal due to an inadequate volume of blood received in culture bottles   Culture   Final    NO GROWTH 3 DAYS Performed at [facility], [address], [place], [place] [address]    Report Status PENDING  Incomplete  SARS Coronavirus 2 [facility] order, Performed in [facility] hospital  lab) Nasopharyngeal Nasopharyngeal Swab     Status: None   Collection Time: 08/25/19  8:59 PM   Specimen: Nasopharyngeal Swab  Result Value Ref Range Status   SARS Coronavirus 2 NEGATIVE NEGATIVE Final    Comment: (NOTE) If result is NEGATIVE SARS-CoV-2 target nucleic acids are NOT DETECTED. The SARS-CoV-2 RNA is generally detectable in upper and lower  respiratory specimens during the acute phase of infection. The lowest  concentration of SARS-CoV-2 viral copies this assay can detect is 250  copies / mL. A negative result does not preclude SARS-CoV-2 infection  and should not be used as the sole basis for treatment or other  patient management decisions.  A negative result may occur with  improper specimen collection / handling, submission of specimen other  than nasopharyngeal swab, presence of viral mutation(s) within the  areas targeted by this assay, and inadequate number of viral copies  (<250 copies / mL). A negative result must be combined with clinical    observations, patient history, and epidemiological information. If result is POSITIVE SARS-CoV-2 target nucleic acids are DETECTED. The SARS-CoV-2 RNA is generally detectable in upper and lower  respiratory specimens dur ing the acute phase of infection.  Positive  results are indicative of active infection with SARS-CoV-2.  Clinical  correlation with patient history and other diagnostic information is  necessary to determine patient infection status.  Positive results do  not rule out bacterial infection or co-infection with other viruses. If result is PRESUMPTIVE POSTIVE SARS-CoV-2 nucleic acids MAY BE PRESENT.   A presumptive positive result was obtained on the submitted specimen  and confirmed on repeat testing.  While 2019 novel coronavirus  (SARS-CoV-2) nucleic acids may be present in the submitted sample  additional confirmatory testing may be necessary for epidemiological  and / or clinical management purposes  to differentiate between  SARS-CoV-2 and other Sarbecovirus currently known to infect humans.  If clinically indicated additional testing with an alternate test  methodology [phone]([id number]) is advised. The SARS-CoV-2 RNA is generally  detectable in upper and lower respiratory sp ecimens during the acute  phase of infection. The expected result is Negative. Fact Sheet for Patients:  BoilerBrush.com.cyhttps://www.fda.gov/media/136312/download Fact Sheet for Healthcare Providers: https://pope.com/https://www.fda.gov/media/136313/download This test is not yet approved or cleared by the [place] FDA and has been authorized for detection and/or diagnosis of SARS-CoV-2 by FDA under an Emergency Use Authorization (EUA).  This EUA will remain in effect (meaning this test can be used) for the duration of the COVID-19 declaration under Section 564(b)(1) of the Act, 21 U.S.C. section 360bbb-3(b)(1), unless the authorization is terminated or revoked sooner. Performed at [facility], [address]  [place], [place] [address]   Urine culture     Status: Abnormal   Collection Time: 08/25/19 10:17 PM   Specimen: Urine, Clean Catch  Result Value Ref Range Status   Specimen Description URINE, CLEAN CATCH  Final   Special Requests NONE  Final   Culture (A)  Final    <10,000 COLONIES/mL INSIGNIFICANT GROWTH Performed at [facility], [address] [place], [place] [address]    Report Status 08/26/2019 FINAL  Final  SARS Coronavirus 2 [facility](Hospital order, Performed in [facility] hospital lab) Nasopharyngeal Nasopharyngeal Swab     Status: None   Collection Time: 08/26/19 10:26 AM   Specimen: Nasopharyngeal Swab  Result Value Ref Range Status   SARS Coronavirus 2 NEGATIVE NEGATIVE Final    Comment: (NOTE) If result is NEGATIVE SARS-CoV-2  target nucleic acids are NOT DETECTED. The SARS-CoV-2 RNA is generally detectable in upper and lower  respiratory specimens during the acute phase of infection. The lowest  concentration of SARS-CoV-2 viral copies this assay can detect is 250  copies / mL. A negative result does not preclude SARS-CoV-2 infection  and should not be used as the sole basis for treatment or other  patient management decisions.  A negative result may occur with  improper specimen collection / handling, submission of specimen other  than nasopharyngeal swab, presence of viral mutation(s) within the  areas targeted by this assay, and inadequate number of viral copies  (<250 copies / mL). A negative result must be combined with clinical  observations, patient history, and epidemiological information. If result is POSITIVE SARS-CoV-2 target nucleic acids are DETECTED. The SARS-CoV-2 RNA is generally detectable in upper and lower  respiratory specimens dur ing the acute phase of infection.  Positive  results are indicative of active infection with SARS-CoV-2.  Clinical  correlation with patient history and other diagnostic information is  necessary to determine patient  infection status.  Positive results do  not rule out bacterial infection or co-infection with other viruses. If result is PRESUMPTIVE POSTIVE SARS-CoV-2 nucleic acids MAY BE PRESENT.   A presumptive positive result was obtained on the submitted specimen  and confirmed on repeat testing.  While 2019 novel coronavirus  (SARS-CoV-2) nucleic acids may be present in the submitted sample  additional confirmatory testing may be necessary for epidemiological  and / or clinical management purposes  to differentiate between  SARS-CoV-2 and other Sarbecovirus currently known to infect humans.  If clinically indicated additional testing with an alternate test  methodology [phone]([id number]) is advised. The SARS-CoV-2 RNA is generally  detectable in upper and lower respiratory sp ecimens during the acute  phase of infection. The expected result is Negative. Fact Sheet for Patients:  BoilerBrush.com.cyhttps://www.fda.gov/media/136312/download Fact Sheet for Healthcare Providers: https://pope.com/https://www.fda.gov/media/136313/download This test is not yet approved or cleared by the [place] FDA and has been authorized for detection and/or diagnosis of SARS-CoV-2 by FDA under an Emergency Use Authorization (EUA).  This EUA will remain in effect (meaning this test can be used) for the duration of the COVID-19 declaration under Section 564(b)(1) of the Act, 21 U.S.C. section 360bbb-3(b)(1), unless the authorization is terminated or revoked sooner. Performed at [facility], [address] [place], [place] [address]   C difficile quick scan w PCR reflex     Status: None   Collection Time: 08/26/19 10:00 PM   Specimen: STOOL  Result Value Ref Range Status   C Diff antigen NEGATIVE NEGATIVE Final   C Diff toxin NEGATIVE NEGATIVE Final   C Diff interpretation No C. difficile detected.  Final    Comment: Performed at [facility], [address] [place], [place] [address]  Gastrointestinal Panel by PCR , Stool     Status:  None   Collection Time: 08/26/19 10:00 PM   Specimen: Stool  Result Value Ref Range Status   Campylobacter species NOT DETECTED NOT DETECTED Final   Plesimonas shigelloides NOT DETECTED NOT DETECTED Final   Salmonella species NOT DETECTED NOT DETECTED Final   Yersinia enterocolitica NOT DETECTED NOT DETECTED Final   Vibrio species NOT DETECTED NOT DETECTED Final   Vibrio cholerae NOT DETECTED NOT DETECTED Final   Enteroaggregative E coli (EAEC) NOT DETECTED NOT DETECTED Final   Enteropathogenic E coli (EPEC) NOT DETECTED NOT DETECTED Final   Enterotoxigenic  E coli (ETEC) NOT DETECTED NOT DETECTED Final   Shiga like toxin producing E coli (STEC) NOT DETECTED NOT DETECTED Final   Shigella/Enteroinvasive E coli (EIEC) NOT DETECTED NOT DETECTED Final   Cryptosporidium NOT DETECTED NOT DETECTED Final   Cyclospora cayetanensis NOT DETECTED NOT DETECTED Final   Entamoeba histolytica NOT DETECTED NOT DETECTED Final   Giardia lamblia NOT DETECTED NOT DETECTED Final   Adenovirus F40/41 NOT DETECTED NOT DETECTED Final   Astrovirus NOT DETECTED NOT DETECTED Final   Norovirus GI/GII NOT DETECTED NOT DETECTED Final   Rotavirus A NOT DETECTED NOT DETECTED Final   Sapovirus (I, II, IV, and V) NOT DETECTED NOT DETECTED Final    Comment: Performed at [facility], [address] [place], [place] [address]  MRSA PCR Screening     Status: None   Collection Time: 08/27/19 10:29 PM   Specimen: Nasopharyngeal  Result Value Ref Range Status   MRSA by PCR NEGATIVE NEGATIVE Final    Comment:        The GeneXpert MRSA Assay (FDA approved for NASAL specimens only), is one component of a comprehensive MRSA colonization surveillance program. It is not intended to diagnose MRSA infection nor to guide or monitor treatment for MRSA infections. Performed at [facility], [address], [place], [place] [address]   SARS CORONAVIRUS 2 (TAT 6-24 HRS) Nasopharyngeal     Status: None    Collection Time: 08/28/19 10:53 AM   Specimen: Nasopharyngeal  Result Value Ref Range Status   SARS Coronavirus 2 NEGATIVE NEGATIVE Final    Comment: (NOTE) SARS-CoV-2 target nucleic acids are NOT DETECTED. The SARS-CoV-2 RNA is generally detectable in upper and lower respiratory specimens during the acute phase of infection. Negative results do not preclude SARS-CoV-2 infection, do not rule out co-infections with other pathogens, and should not be used as the sole basis for treatment or other patient management decisions. Negative results must be combined with clinical observations, patient history, and epidemiological information. The expected result is Negative. Fact Sheet for Patients: HairSlick.no Fact Sheet for Healthcare Providers: quierodirigir.com This test is not yet approved or cleared by the [place] FDA and  has been authorized for detection and/or diagnosis of SARS-CoV-2 by FDA under an Emergency Use Authorization (EUA). This EUA will remain  in effect (meaning this test can be used) for the duration of the COVID-19 declaration under Section 56 4(b)(1) of the Act, 21 U.S.C. section 360bbb-3(b)(1), unless the authorization is terminated or revoked sooner. Performed at [facility], [address] [place], [place] [address]      Labs: Basic Metabolic Panel: Recent Labs  Lab 08/25/19 1937 08/26/19 0133 08/27/19 0456  NA 135 130* 134*  K 5.0 4.3 3.8  CL 102 103 100  CO2 20* 21* 20*  GLUCOSE 131* 113* 104*  BUN 18 16 14   CREATININE 1.53* 1.42* 1.27*  CALCIUM 10.1 9.3 9.0  MG  --   --  1.3*  PHOS  --   --  3.7   Liver Function Tests: Recent Labs  Lab 08/25/19 1937 08/26/19 0133 08/27/19 0456  AST 28 19 24   ALT 18 14 16   ALKPHOS 76 70 69  BILITOT 0.7 0.6 0.8  PROT 8.3* 7.2 7.4  ALBUMIN 4.0 3.3* 3.2*   CBC: Recent Labs  Lab 08/25/19 1937 08/26/19 0133 08/27/19 0456  WBC 8.8 6.9  5.9  NEUTROABS 7.7  --  3.6  HGB 12.0 10.9* 11.8*  HCT 37.9 34.8*  36.8  MCV 94.3 94.6 92.2  PLT 233 206 206   Cardiac Enzymes: Recent Labs  Lab 08/27/19 0456  CKTOTAL 349*    Recent Labs    08/26/19 0133  BNP 163.9*   CBG: Recent Labs  Lab 08/28/19 0745 08/28/19 1125 08/28/19 1601 08/28/19 2151 08/29/19 0750  GLUCAP 90 209* 101* 123* 96    Principal Problem:   Febrile illness Active Problems:   Hypertension   Dementia (HCC)   DM (diabetes mellitus) (HCC)   Diarrhea   Hypomagnesemia   Time coordinating discharge: 35 minutes  Signed:  Brendia Sacks, MD  [facility]  08/29/2019, 11:03 AM

## 2019-08-29 NOTE — NC FL2 (Signed)
[place] MEDICAID FL2 LEVEL OF CARE SCREENING TOOL     IDENTIFICATION  Patient Name: Katrina Burton Birthdate: [birth year] Sex: female Admission Date (Current Location): 08/25/2019  [facility] [place]   Facility and Address:  [facility], [address], [place], [place] [address]      Provider Number: [id number]  Attending Physician Name and Address:  Standley BrookingGoodrich, Daniel P, MD  Relative Name and Phone Number:       Current Level of Care: Hospital Recommended Level of Care: Memory Care Prior Approval Number:    Date Approved/Denied:   PASRR Number:    Discharge Plan: Other (Comment)(memory care)    Current Diagnoses: Patient Active Problem List   Diagnosis Date Noted  . Diarrhea 08/27/2019  . Hypomagnesemia 08/27/2019  . Febrile illness 08/26/2019  . DKA (diabetic ketoacidoses) (HCC) 12/12/2013  . Hyponatremia 12/12/2013  . Acute kidney injury (HCC) 12/12/2013  . UTI (lower urinary tract infection) 04/29/2013  . DM (diabetes mellitus) (HCC) 04/29/2013  . Hypertension   . Dementia (HCC)     Orientation RESPIRATION BLADDER Height & Weight     Self, Place  Normal Continent Weight: 189 lb 2.5 oz (85.8 kg) Height:  5\' 4"  (162.6 cm)  BEHAVIORAL SYMPTOMS/MOOD NEUROLOGICAL BOWEL NUTRITION STATUS      Continent Diet(regular)  AMBULATORY STATUS COMMUNICATION OF NEEDS Skin   Limited Assist Verbally Normal                       Personal Care Assistance Level of Assistance  Bathing, Feeding, Dressing Bathing Assistance: Limited assistance Feeding assistance: Limited assistance Dressing Assistance: Limited assistance     Functional Limitations Info  Sight, Hearing, Speech Sight Info: Adequate Hearing Info: Adequate Speech Info: Adequate    SPECIAL CARE FACTORS FREQUENCY                       Contractures Contractures Info: Not present    Additional Factors Info  Code Status, Allergies, Psychotropic,  Insulin Sliding Scale Code Status Info: Full Allergies Info: Bumetanide, Lasix Furosemide, Lipitor Atorvastatin, Other, Soap, Statins, Torsemide Psychotropic Info: Lexapro 10mg  daily; Ativan 1mg  daily at bed; Zyprexa 10mg  daily at bed Insulin Sliding Scale Info: 0-15 units 3x/day with meals; 0-5 units daily at bed       Current Medications (08/29/2019):  This is the current hospital active medication list Current Facility-Administered Medications  Medication Dose Route Frequency Provider Last Rate Last Dose  . acetaminophen (TYLENOL) tablet 650 mg  650 mg Oral Q6H PRN Bodenheimer, Charles A, NP   650 mg at 08/28/19 1840  . amLODipine (NORVASC) tablet 10 mg  10 mg Oral q morning - 10a Lyda PeroneGardner, Jared M, DO   10 mg at 08/29/19 1002  . enoxaparin (LOVENOX) injection 40 mg  40 mg Subcutaneous Daily Lyda PeroneGardner, Jared M, DO   40 mg at 08/29/19 1002  . escitalopram (LEXAPRO) tablet 10 mg  10 mg Oral Daily Lyda PeroneGardner, Jared M, DO   10 mg at 08/29/19 1002  . insulin aspart (novoLOG) injection 0-15 Units  0-15 Units Subcutaneous TID WC Hillary BowGardner, Jared M, DO   2 Units at 08/29/19 1157  . insulin aspart (novoLOG) injection 0-5 Units  0-5 Units Subcutaneous QHS Hillary BowGardner, Jared M, DO      . LORazepam (ATIVAN) tablet 1 mg  1 mg Oral QHS Lyda PeroneGardner, Jared M, DO   1 mg at 08/28/19 2119  . OLANZapine zydis (  ZYPREXA) disintegrating tablet 10 mg  10 mg Oral QHS Jennette Kettle M, DO   10 mg at 08/28/19 2119  . pantoprazole (PROTONIX) EC tablet 20 mg  20 mg Oral Daily Jennette Kettle M, DO   20 mg at 08/29/19 1002  . traMADol (ULTRAM) tablet 50 mg  50 mg Oral BID Jennette Kettle M, DO   50 mg at 08/29/19 1002     Discharge Medications: STOP taking these medications   sulfamethoxazole-trimethoprim 800-160 MG tablet Commonly known as: BACTRIM DS     TAKE these medications   acetaminophen 500 MG tablet Commonly known as: TYLENOL Take 500 mg by mouth every 4 (four) hours as needed for fever or headache.   alum & mag  hydroxide-simeth 200-200-20 MG/5ML suspension Commonly known as: MAALOX/MYLANTA Take 30 mLs by mouth every 6 (six) hours as needed for indigestion or heartburn.   amLODipine 10 MG tablet Commonly known as: NORVASC Take 10 mg by mouth every morning.   BAZA PROTECT EX Apply 1 application topically See admin instructions. Every shift   Minerin Creme Crea Apply 1 application topically 2 (two) times daily. 06AM and 1800 Apply to total body   cetirizine 10 MG tablet Commonly known as: ZYRTEC Take 10 mg by mouth daily as needed for allergies.   escitalopram 10 MG tablet Commonly known as: LEXAPRO Take 10 mg by mouth daily.   guaifenesin 100 MG/5ML syrup Commonly known as: ROBITUSSIN Take 200 mg by mouth every 6 (six) hours as needed for cough.   insulin aspart 100 UNIT/ML injection Commonly known as: novoLOG Inject 6 Units into the skin as needed for high blood sugar. Sliding scale BS 0.350 = 0 unitrs If blood sugar is greater than 350 give 6 units FSBS every Monday, Wednesday and Friday   loperamide 2 MG capsule Commonly known as: IMODIUM Take 2 mg by mouth as needed for diarrhea or loose stools. For diarrhea   LORazepam 0.5 MG tablet Commonly known as: ATIVAN Take 2 tablets (1 mg total) by mouth at bedtime.   magnesium hydroxide 400 MG/5ML suspension Commonly known as: MILK OF MAGNESIA Take 30 mLs by mouth at bedtime as needed for mild constipation.   metFORMIN 1000 MG tablet Commonly known as: GLUCOPHAGE Take 1,000 mg by mouth 2 (two) times daily with a meal.   nystatin powder Generic drug: nystatin Apply 1 Bottle topically daily as needed (Apply to Groin).   OLANZapine zydis 10 MG disintegrating tablet Commonly known as: ZYPREXA Take 10 mg by mouth at bedtime.   olopatadine 0.1 % ophthalmic solution Commonly known as: PATANOL Place 1 drop into both eyes daily as needed for allergies.   OPTIVE 0.5-0.9 % ophthalmic solution Generic drug:  carboxymethylcellul-glycerin Place 1 drop into both eyes 2 (two) times daily.   pantoprazole 20 MG tablet Commonly known as: PROTONIX Take 20 mg by mouth daily.   spironolactone 25 MG tablet Commonly known as: ALDACTONE Take 25 mg by mouth daily.   traMADol 50 MG tablet Commonly known as: ULTRAM Take 1 tablet (50 mg total) by mouth 2 (two) times daily. Foot pain.   Triple Antibiotic [phone] Oint Apply 1 application topically as needed (Minor skin tears or abrasions).   Vitamin D3 50 MCG (2000 UT) Tabs Take 2,000 Units by mouth daily.     Relevant Imaging Results:  Relevant Lab Results:   Additional Information SS#: [id number]  Geralynn Ochs, LCSW

## 2019-08-30 LAB — CULTURE, BLOOD (ROUTINE X 2)
Culture: NO GROWTH
Culture: NO GROWTH

## 2019-08-31 LAB — URINE CULTURE: Culture: 10000 — AB

## 2019-11-13 ENCOUNTER — Other Ambulatory Visit: Payer: Self-pay

## 2019-11-13 ENCOUNTER — Encounter (HOSPITAL_COMMUNITY): Payer: Self-pay

## 2019-11-13 ENCOUNTER — Emergency Department (HOSPITAL_COMMUNITY): Payer: Medicare Other

## 2019-11-13 ENCOUNTER — Emergency Department (HOSPITAL_COMMUNITY)
Admission: EM | Admit: 2019-11-13 | Discharge: 2019-11-13 | Disposition: A | Discharge status: Home / Self Care | Payer: Medicare Other | Source: Home / Self Care | Attending: Emergency Medicine | Admitting: Emergency Medicine

## 2019-11-13 DIAGNOSIS — R2232 Localized swelling, mass and lump, left upper limb: Secondary | ICD-10-CM | POA: Insufficient documentation

## 2019-11-13 DIAGNOSIS — N179 Acute kidney failure, unspecified: Secondary | ICD-10-CM | POA: Diagnosis not present

## 2019-11-13 DIAGNOSIS — Z794 Long term (current) use of insulin: Secondary | ICD-10-CM | POA: Insufficient documentation

## 2019-11-13 DIAGNOSIS — E119 Type 2 diabetes mellitus without complications: Secondary | ICD-10-CM | POA: Insufficient documentation

## 2019-11-13 DIAGNOSIS — Z79899 Other long term (current) drug therapy: Secondary | ICD-10-CM | POA: Insufficient documentation

## 2019-11-13 DIAGNOSIS — F0390 Unspecified dementia without behavioral disturbance: Secondary | ICD-10-CM | POA: Insufficient documentation

## 2019-11-13 DIAGNOSIS — W19XXXA Unspecified fall, initial encounter: Secondary | ICD-10-CM

## 2019-11-13 DIAGNOSIS — I10 Essential (primary) hypertension: Secondary | ICD-10-CM | POA: Insufficient documentation

## 2019-11-13 MED ORDER — CIPROFLOXACIN HCL 500 MG PO TABS
750.0000 mg | ORAL_TABLET | Freq: Once | ORAL | Status: AC
  Administered 2019-11-13: 22:00:00 750 mg via ORAL
  Filled 2019-11-13: qty 2

## 2019-11-13 NOTE — ED Provider Notes (Addendum)
[facility]-EMERGENCY DEPT Provider Note   CSN: [id number] Arrival date & time: 11/13/19  1817     History   Chief Complaint No chief complaint on file.   HPI Misao Fackrell is a 83 y.o. female history of dementia, HTN, DM presenting from SNF.     HPI  Complains of left arm pain which worse with movement, worse with touch, with associated swelling.  Patient denies any trauma, fall or injury to the arm.  Patient is unsure how long the pain has been going on for states that has been going on for 3 weeks however on further questioning she states is been going on for 3 days.  Patient states that she is able to usually get around with her walker but today it is been difficult secondary to pain with supporting her weight with walker.  Called [facility] for collateral history: Per CNA who has a phone, patient has been guarding her left arm since approximately 4:30 PM today, CNA states that she had had no arm pain yesterday and has been well-appearing before.  States that they checked blood sugar and vitals which were within normal limits.  EMS was then called who transported patient to ED.  Patient did not complain of any other symptoms besides left arm pain.  On second interview with patient, patient admits to falling onto her bed today related to her left arm got stuck underneath her when she fell onto it.  Patient denies any other pain.  Denies any chest pain, endorses shortness of breath but states is been ongoing for a long time however she is not sure how long.  Denies nausea vomiting or diarrhea.   Past Medical History:  Diagnosis Date  . Acute kidney injury (HCC)   . Dementia (HCC)   . Diabetes mellitus   . Hyperlipidemia   . Hypertension     Patient Active Problem List   Diagnosis Date Noted  . Diarrhea 08/27/2019  . Hypomagnesemia 08/27/2019  . Febrile illness 08/26/2019  . DKA (diabetic ketoacidoses) (HCC) 12/12/2013  .  Hyponatremia 12/12/2013  . Acute kidney injury (HCC) 12/12/2013  . UTI (lower urinary tract infection) 04/29/2013  . DM (diabetes mellitus) (HCC) 04/29/2013  . Hypertension   . Dementia [facility])     Past Surgical History:  Procedure Laterality Date  . BOTOX INJECTION N/A 06/15/2014   Procedure: BOTOX INJECTION;  Surgeon: Petra Kuba, MD;  Location: [facility] ENDOSCOPY;  Service: Endoscopy;  Laterality: N/A;  . ESOPHAGOGASTRODUODENOSCOPY (EGD) WITH PROPOFOL N/A 06/15/2014   Procedure: ESOPHAGOGASTRODUODENOSCOPY (EGD) WITH PROPOFOL;  Surgeon: Petra Kuba, MD;  Location: [facility] ENDOSCOPY;  Service: Endoscopy;  Laterality: N/A;  h&p in file cabinet     OB History   No obstetric history on file.      Home Medications    Prior to Admission medications   Medication Sig Start Date End Date Taking? Authorizing Provider  acetaminophen (TYLENOL) 500 MG tablet Take 500 mg by mouth every 4 (four) hours as needed for fever or headache.     [provider]  alum & mag hydroxide-simeth (MAALOX/MYLANTA) 200-200-20 MG/5ML suspension Take 30 mLs by mouth every 6 (six) hours as needed for indigestion or heartburn.    [provider]  amLODipine (NORVASC) 10 MG tablet Take 10 mg by mouth every morning.     [provider]  carboxymethylcellul-glycerin (OPTIVE) 0.5-0.9 % ophthalmic solution Place 1 drop into both eyes 2 (two) times daily.  [provider]  cetirizine (ZYRTEC) 10 MG tablet Take 10 mg by mouth daily as needed for allergies.    [provider]  Cholecalciferol (VITAMIN D3) 2000 UNITS TABS Take 2,000 Units by mouth daily.    [provider]  escitalopram (LEXAPRO) 10 MG tablet Take 10 mg by mouth daily.    [provider]  guaifenesin (ROBITUSSIN) 100 MG/5ML syrup Take 200 mg by mouth every 6 (six) hours as needed for cough.    [provider]  insulin aspart (NOVOLOG) 100 UNIT/ML injection Inject 6 Units into the skin as needed for  high blood sugar. Sliding scale BS 0.350 = 0 unitrs If blood sugar is greater than 350 give 6 units FSBS every Monday, Wednesday and Friday    [provider]  loperamide (IMODIUM) 2 MG capsule Take 2 mg by mouth as needed for diarrhea or loose stools. For diarrhea    [provider]  LORazepam (ATIVAN) 0.5 MG tablet Take 2 tablets (1 mg total) by mouth at bedtime. 08/29/19   Standley Brooking, MD  magnesium hydroxide (MILK OF MAGNESIA) 400 MG/5ML suspension Take 30 mLs by mouth at bedtime as needed for mild constipation.    [provider]  metFORMIN (GLUCOPHAGE) 1000 MG tablet Take 1,000 mg by mouth 2 (two) times daily with a meal.    [provider]  Neomycin-Bacitracin-Polymyxin (TRIPLE ANTIBIOTIC) [phone] OINT Apply 1 application topically as needed (Minor skin tears or abrasions).    [provider]  nystatin (NYSTATIN) powder Apply 1 Bottle topically daily as needed (Apply to Groin).    [provider]  OLANZapine zydis (ZYPREXA) 10 MG disintegrating tablet Take 10 mg by mouth at bedtime.    [provider]  olopatadine (PATANOL) 0.1 % ophthalmic solution Place 1 drop into both eyes daily as needed for allergies.    [provider]  pantoprazole (PROTONIX) 20 MG tablet Take 20 mg by mouth daily.     [provider]  Skin Protectants, Misc. (BAZA PROTECT EX) Apply 1 application topically See admin instructions. Every shift    [provider]  Skin Protectants, Misc. (MINERIN CREME) CREA Apply 1 application topically 2 (two) times daily. 06AM and 1800 Apply to total body    [provider]  spironolactone (ALDACTONE) 25 MG tablet Take 25 mg by mouth daily.    [provider]  traMADol (ULTRAM) 50 MG tablet Take 1 tablet (50 mg total) by mouth 2 (two) times daily. Foot pain. 08/29/19   Standley Brooking, MD    Family History History reviewed. No pertinent family history.  Social  History Social History   Tobacco Use  . Smoking status: Never Smoker  . Smokeless tobacco: Never Used  Substance Use Topics  . Alcohol use: No  . Drug use: No     Allergies   Bumetanide, Lasix [furosemide], Lipitor [atorvastatin], Other, Soap, Statins, and Torsemide   Review of Systems Review of Systems  Constitutional: Negative for fever.  HENT: Negative for congestion.   Respiratory: Negative for cough and chest tightness.   Cardiovascular: Negative for chest pain and palpitations.  Gastrointestinal: Negative for vomiting.  Musculoskeletal:       Forearm pain  Skin: Negative for rash and wound.  Neurological: Negative for dizziness and light-headedness.  All other systems reviewed and are negative.    Physical Exam Updated Vital Signs BP (!) 162/77 (BP Location: Right Arm)   Pulse 84   Temp 99.6 F (37.6  C) (Oral)   Resp (!) 24   SpO2 100%   Physical Exam Vitals signs and nursing note reviewed.  Constitutional:      General: She is not in acute distress.    Comments: Patient appears stated age 47 year old, chronically frail-appearing  HENT:     Head: Normocephalic and atraumatic.     Nose: Nose normal.  Eyes:     General: No scleral icterus.    Pupils: Pupils are equal, round, and reactive to light.  Neck:     Musculoskeletal: Normal range of motion.  Cardiovascular:     Rate and Rhythm: Normal rate and regular rhythm.     Pulses: Normal pulses.     Heart sounds: Normal heart sounds.  Pulmonary:     Effort: Pulmonary effort is normal. No respiratory distress.     Breath sounds: No wheezing.     Comments: Patient respiratory rate is 22 on my examination.  No increased work of breathing over her breaths do appear to be shallow.  Lung sounds clear to auscultation bilaterally. Abdominal:     Palpations: Abdomen is soft.     Tenderness: There is no abdominal tenderness.     Comments: Protuberant abdomen.  Nontender to palpation.  No guarding, no rebound.   Musculoskeletal:     Right lower leg: Edema present.     Left lower leg: Edema present.     Comments: Left forearm with swelling from the mid forearm down to the hand.  Visible edema in the dorsum of the hand.  Left upper extremity with tenderness to palpation diffusely in the hand, wrist, elbow, forearm, humerus and shoulder.  Painful intact and full range of motion passively.  Patient unwilling to actively range elbow.  At rest but is able to squeeze with symmetric strength.  Unwilling to move her shoulder.  Compression stockings on lower extremities.  Mild lateral symmetric edema.  No tenderness to calf.  Negative Homans' sign bilaterally.  Skin:    General: Skin is warm and dry.     Capillary Refill: Capillary refill takes less than 2 seconds.  Neurological:     Mental Status: She is alert. Mental status is at baseline.  Psychiatric:        Mood and Affect: Mood normal.        Behavior: Behavior normal.      ED Treatments / Results  Labs (all labs ordered are listed, but only abnormal results are displayed) Labs Reviewed - No data to display  EKG None  Radiology Dg Wrist Complete Left  Result Date: 11/13/2019 CLINICAL DATA:  Arm swelling, pain EXAM: LEFT WRIST - COMPLETE 3+ VIEW COMPARISON:  None. FINDINGS: Soft tissue swelling along the dorsum of the wrist and hand. No acute bony abnormality. Specifically, no fracture, subluxation, or dislocation. Joint spaces maintained. Early spurring in the 1st carpometacarpal joint and radiocarpal joint. IMPRESSION: No acute bony abnormality. Electronically Signed   By: Rolm Baptise M.D.   On: 11/13/2019 20:39   Dg Shoulder Left  Result Date: 11/13/2019 CLINICAL DATA:  Left arm pain, swelling. EXAM: LEFT SHOULDER - 2+ VIEW COMPARISON:  None. FINDINGS: Degenerative changes in the [facility] joint with joint space narrowing and spurring. Glenohumeral joint is maintained. No acute bony abnormality. Specifically, no fracture, subluxation, or  dislocation. Soft tissues are intact. IMPRESSION: Degenerative changes in the [facility] joint.  No acute bony abnormality. Electronically Signed   By: Rolm Baptise M.D.   On: 11/13/2019 20:39   Dg Humerus Left  Result Date: 11/13/2019 CLINICAL DATA:  Left arm pain, swelling EXAM: LEFT HUMERUS - 2+ VIEW COMPARISON:  None. FINDINGS: Early degenerative changes in the left [facility] joint with joint space narrowing and spurring. No acute bony abnormality. Specifically, no fracture, subluxation, or dislocation. IMPRESSION: No acute bony abnormality. Electronically Signed   By: Charlett Nose M.D.   On: 11/13/2019 20:38   Dg Hand Complete Left  Result Date: 11/13/2019 CLINICAL DATA:  Left arm pain, swelling EXAM: LEFT HAND - COMPLETE 3+ VIEW COMPARISON:  None. FINDINGS: Soft tissue swelling noted along the dorsum of the hand. No acute bony abnormality. Specifically, no fracture, subluxation, or dislocation. Joint spaces maintained. IMPRESSION: No acute bony abnormality. Electronically Signed   By: Charlett Nose M.D.   On: 11/13/2019 20:38    Procedures Procedures (including critical care time)  Medications Ordered in ED Medications  ciprofloxacin (CIPRO) tablet 750 mg (has no administration in time range)     Initial Impression / Assessment and Plan / ED Course  I have reviewed the triage vital signs and the nursing notes.  Pertinent labs & imaging results that were available during my care of the patient were reviewed by me and considered in my medical decision making (see chart for details).         Patient is 83 year old female with history of dementia presenting from SNF today for left arm pain.  Per EMS, patient states is been there for 3 weeks; after discussion with SNF they communicated that patient has been symptom-free until today at approximately 4:30pm when she was noted to be guarding her left arm.  Patient is poor historian however after multiple attempts at interviewing patient she stated that  she recalls falling on her over onto her bed today in a way that trapped her left arm underneath her.  She states that it has been constant pain since then.   On physical exam patient does have swelling of the left distal forearm, wrist, left hand.  She is however able to grip symmetrically, able to move all of her joints, has good sensation, has intact and symmetrical pulses.  There are no signs of cellulitis, no signs of fracture, necrotizing fasciitis as there is no crepitus or soft tissue tenderness to palpation, DVT seems unlikely as patient is not any respiratory symptoms and age.  Doubt that arm pain is related to cardiac pain as it is more distal areas that are painful and no swelling to the distal arm.  The is also reproducible with physical exam movement of arm.   X-rays obtained and are negative for any fracture of the left arm.  There are numerous degenerative changes.  Due to swelling of the left distal forearm, wrist and hand has scheduled patient to have follow-up DVT study of her left upper extremity to exclude clot in her upper extremity.  This seems unlikely since there is no traumatic explanation for her symptoms and I suspect that she has acute on chronic osteoarthritis/degenerative changes of her wrist and elbow.  I called [facility] and discussed plan to discharge patient home and have her go to [facility] tomorrow morning for vascular ultrasound. I discussed with this patient as well who expresses understanding however with her underlying dementia I made sure to communicate instructions to SNF as well as print this information in her chart, discharge paperwork and in follow up appointments.   Patient has elevated blood pressure but otherwise normal vitals on my assessment she is not tachypneic.  On my reevaluation of the patient she is asleep comfortably in bed in the hallway bed.  She is easily arousable and continues to have diffuse left arm pain.  Swelling has not  improved.  Most likely this is musculoskeletal injury and will improve with time-as patient has a history of dementia and has old with poor ambulatory abilities and is not able to use her walker effectively with her arm pain I have decided not to start anticoagulation for this patient unless there is a confirmed upper extremity DVT.   Patient erroneously received one dose of ciprofloxacin by mistake. I talked to the patient about this. I also discussed with my attending Dr. Jacqulyn BathLong.   This patient appears reasonably screened and I doubt any other medical condition requiring further workup, evaluation, or treatment in the ED at this time prior to discharge.   Patient's vitals are WNL apart from vital sign abnormalities discussed above, patient is in NAD, and able to ambulate in the ED at their baseline. Pain has been managed or a plan has been made for home management and has no complaints prior to discharge. Patient is comfortable with above plan and is stable for discharge at this time. All questions were answered prior to disposition. Results from the ER workup discussed with the patient face to face and all questions answered to the best of my ability. The patient is safe for discharge with strict return precautions. Patient appears safe for discharge with appropriate follow-up. Conveyed my impression with the patient and they voiced understanding and are agreeable to plan.   An After Visit Summary was printed and given to the patient.  Portions of this note were generated with Scientist, clinical (histocompatibility and immunogenetics)Dragon dictation software. Dictation errors may occur despite best attempts at proofreading.    Final Clinical Impressions(s) / ED Diagnoses   Final diagnoses:  Localized swelling on left hand  Fall, initial encounter    ED Discharge Orders         Ordered    UE VENOUS DUPLEX     11/13/19 2101           Gailen ShelterFondaw, Kamylle Axelson S, [place] 11/13/19 2115    Gailen ShelterFondaw, Kyair Ditommaso S, [place] 11/13/19 2220    Maia PlanLong, Joshua G, MD  11/14/19 1129    Solon AugustaFondaw, Tim Wilhide [place], [place] 11/15/19 [id number]    Maia PlanLong, Joshua G, MD 11/15/19 1032

## 2019-11-13 NOTE — ED Notes (Signed)
[facility] called for transport.  

## 2019-11-13 NOTE — ED Notes (Signed)
Patient transported to X-ray 

## 2019-11-13 NOTE — Discharge Instructions (Addendum)
Please go to [facility] tomorrow at 8am to check in at vascular lab.  He will have a ultrasound done of your left arm to make sure that there is no blood clot in your arm. Your xrays show no broken bones.

## 2019-11-13 NOTE — ED Triage Notes (Signed)
Pt BIB EMS from [facility]. Pt reports left arm pain x3 weeks. Some swelling noted to left arm. A&O x3.  BP 174/100 HR 76 96% RA Temp 97.4 CBG 156

## 2019-11-14 ENCOUNTER — Ambulatory Visit (HOSPITAL_COMMUNITY): Payer: Medicare Other

## 2019-11-16 ENCOUNTER — Encounter (HOSPITAL_COMMUNITY): Payer: Self-pay

## 2019-11-16 ENCOUNTER — Emergency Department (HOSPITAL_COMMUNITY): Payer: Medicare Other

## 2019-11-16 ENCOUNTER — Other Ambulatory Visit: Payer: Self-pay

## 2019-11-16 ENCOUNTER — Inpatient Hospital Stay (HOSPITAL_COMMUNITY)
Admission: EM | Admit: 2019-11-16 | Discharge: 2019-11-19 | DRG: 682 | Disposition: A | Discharge status: Home / Self Care | Payer: Medicare Other | Source: Skilled Nursing Facility | Attending: Internal Medicine | Admitting: Internal Medicine

## 2019-11-16 DIAGNOSIS — F329 Major depressive disorder, single episode, unspecified: Secondary | ICD-10-CM | POA: Diagnosis present

## 2019-11-16 DIAGNOSIS — Z79890 Hormone replacement therapy (postmenopausal): Secondary | ICD-10-CM | POA: Diagnosis not present

## 2019-11-16 DIAGNOSIS — G9341 Metabolic encephalopathy: Secondary | ICD-10-CM | POA: Diagnosis present

## 2019-11-16 DIAGNOSIS — N179 Acute kidney failure, unspecified: Principal | ICD-10-CM | POA: Diagnosis present

## 2019-11-16 DIAGNOSIS — R52 Pain, unspecified: Secondary | ICD-10-CM

## 2019-11-16 DIAGNOSIS — Z888 Allergy status to other drugs, medicaments and biological substances status: Secondary | ICD-10-CM | POA: Diagnosis not present

## 2019-11-16 DIAGNOSIS — R627 Adult failure to thrive: Secondary | ICD-10-CM | POA: Diagnosis present

## 2019-11-16 DIAGNOSIS — Z794 Long term (current) use of insulin: Secondary | ICD-10-CM | POA: Diagnosis not present

## 2019-11-16 DIAGNOSIS — N1831 Chronic kidney disease, stage 3a: Secondary | ICD-10-CM | POA: Diagnosis present

## 2019-11-16 DIAGNOSIS — I129 Hypertensive chronic kidney disease with stage 1 through stage 4 chronic kidney disease, or unspecified chronic kidney disease: Secondary | ICD-10-CM | POA: Diagnosis present

## 2019-11-16 DIAGNOSIS — Z9181 History of falling: Secondary | ICD-10-CM

## 2019-11-16 DIAGNOSIS — D72829 Elevated white blood cell count, unspecified: Secondary | ICD-10-CM | POA: Diagnosis present

## 2019-11-16 DIAGNOSIS — I10 Essential (primary) hypertension: Secondary | ICD-10-CM | POA: Diagnosis present

## 2019-11-16 DIAGNOSIS — G309 Alzheimer's disease, unspecified: Secondary | ICD-10-CM | POA: Diagnosis not present

## 2019-11-16 DIAGNOSIS — E119 Type 2 diabetes mellitus without complications: Secondary | ICD-10-CM

## 2019-11-16 DIAGNOSIS — G8929 Other chronic pain: Secondary | ICD-10-CM | POA: Diagnosis present

## 2019-11-16 DIAGNOSIS — Z7189 Other specified counseling: Secondary | ICD-10-CM | POA: Diagnosis not present

## 2019-11-16 DIAGNOSIS — E785 Hyperlipidemia, unspecified: Secondary | ICD-10-CM | POA: Diagnosis present

## 2019-11-16 DIAGNOSIS — N1832 Chronic kidney disease, stage 3b: Secondary | ICD-10-CM | POA: Diagnosis not present

## 2019-11-16 DIAGNOSIS — M79602 Pain in left arm: Secondary | ICD-10-CM | POA: Diagnosis present

## 2019-11-16 DIAGNOSIS — E860 Dehydration: Secondary | ICD-10-CM | POA: Diagnosis present

## 2019-11-16 DIAGNOSIS — Z515 Encounter for palliative care: Secondary | ICD-10-CM | POA: Diagnosis not present

## 2019-11-16 DIAGNOSIS — F0390 Unspecified dementia without behavioral disturbance: Secondary | ICD-10-CM | POA: Diagnosis present

## 2019-11-16 DIAGNOSIS — Z20828 Contact with and (suspected) exposure to other viral communicable diseases: Secondary | ICD-10-CM | POA: Diagnosis present

## 2019-11-16 DIAGNOSIS — E1122 Type 2 diabetes mellitus with diabetic chronic kidney disease: Secondary | ICD-10-CM | POA: Diagnosis present

## 2019-11-16 DIAGNOSIS — N183 Chronic kidney disease, stage 3 unspecified: Secondary | ICD-10-CM | POA: Diagnosis present

## 2019-11-16 DIAGNOSIS — F0280 Dementia in other diseases classified elsewhere without behavioral disturbance: Secondary | ICD-10-CM | POA: Diagnosis not present

## 2019-11-16 LAB — TROPONIN I (HIGH SENSITIVITY)
Troponin I (High Sensitivity): 9 ng/L (ref <18)
Troponin I (High Sensitivity): 10 ng/L (ref <18)

## 2019-11-16 LAB — CBC WITH DIFFERENTIAL/PLATELET
Abs Immature Granulocytes: 0.04 10*3/uL (ref 0.00–0.07)
Basophils Absolute: 0 10*3/uL (ref 0.0–0.1)
Basophils Relative: 0 %
Eosinophils Absolute: 0.2 10*3/uL (ref 0.0–0.5)
Eosinophils Relative: 1 %
HCT: 35.1 % — ABNORMAL LOW (ref 36.0–46.0)
Hemoglobin: 10.8 g/dL — ABNORMAL LOW (ref 12.0–15.0)
Immature Granulocytes: 0 %
Lymphocytes Relative: 28 %
Lymphs Abs: 3 10*3/uL (ref 0.7–4.0)
MCH: 29.6 pg (ref 26.0–34.0)
MCHC: 30.8 g/dL (ref 30.0–36.0)
MCV: 96.2 fL (ref 80.0–100.0)
Monocytes Absolute: 0.7 10*3/uL (ref 0.1–1.0)
Monocytes Relative: 7 %
Neutro Abs: 6.7 10*3/uL (ref 1.7–7.7)
Neutrophils Relative %: 64 %
Platelets: 279 10*3/uL (ref 150–400)
RBC: 3.65 MIL/uL — ABNORMAL LOW (ref 3.87–5.11)
RDW: 13 % (ref 11.5–15.5)
WBC: 10.6 10*3/uL — ABNORMAL HIGH (ref 4.0–10.5)
nRBC: 0 % (ref 0.0–0.2)

## 2019-11-16 LAB — COMPREHENSIVE METABOLIC PANEL
ALT: 19 U/L (ref 0–44)
AST: 33 U/L (ref 15–41)
Albumin: 4 g/dL (ref 3.5–5.0)
Alkaline Phosphatase: 73 U/L (ref 38–126)
Anion gap: 9 (ref 5–15)
BUN: 63 mg/dL — ABNORMAL HIGH (ref 8–23)
CO2: 21 mmol/L — ABNORMAL LOW (ref 22–32)
Calcium: 10.1 mg/dL (ref 8.9–10.3)
Chloride: 106 mmol/L (ref 98–111)
Creatinine, Ser: 3.96 mg/dL — ABNORMAL HIGH (ref 0.44–1.00)
GFR calc Af Amer: 12 mL/min — ABNORMAL LOW (ref >60)
GFR calc non Af Amer: 10 mL/min — ABNORMAL LOW (ref >60)
Glucose, Bld: 152 mg/dL — ABNORMAL HIGH (ref 70–99)
Potassium: 5.3 mmol/L — ABNORMAL HIGH (ref 3.5–5.1)
Sodium: 136 mmol/L (ref 135–145)
Total Bilirubin: 0.5 mg/dL (ref 0.3–1.2)
Total Protein: 8.3 g/dL — ABNORMAL HIGH (ref 6.5–8.1)

## 2019-11-16 LAB — URINALYSIS, ROUTINE W REFLEX MICROSCOPIC
Bilirubin Urine: NEGATIVE
Glucose, UA: NEGATIVE mg/dL
Hgb urine dipstick: NEGATIVE
Ketones, ur: NEGATIVE mg/dL
Leukocytes,Ua: NEGATIVE
Nitrite: NEGATIVE
Protein, ur: NEGATIVE mg/dL
Specific Gravity, Urine: 1.018 (ref 1.005–1.030)
pH: 5 (ref 5.0–8.0)

## 2019-11-16 LAB — LACTIC ACID, PLASMA
Lactic Acid, Venous: 1.5 mmol/L (ref 0.5–1.9)
Lactic Acid, Venous: 1.3 mmol/L (ref 0.5–1.9)

## 2019-11-16 LAB — AMMONIA: Ammonia: 10 umol/L (ref 9–35)

## 2019-11-16 LAB — POC SARS CORONAVIRUS 2 AG -  ED: SARS Coronavirus 2 Ag: NEGATIVE

## 2019-11-16 MED ORDER — OLANZAPINE 10 MG PO TBDP
10.0000 mg | ORAL_TABLET | Freq: Every evening | ORAL | Status: DC
  Administered 2019-11-17 – 2019-11-18 (×2): 10 mg via ORAL
  Filled 2019-11-16 (×3): qty 1

## 2019-11-16 MED ORDER — POLYVINYL ALCOHOL 1.4 % OP SOLN
1.0000 [drp] | Freq: Two times a day (BID) | OPHTHALMIC | Status: DC
  Administered 2019-11-17 – 2019-11-19 (×6): 1 [drp] via OPHTHALMIC
  Filled 2019-11-16: qty 15

## 2019-11-16 MED ORDER — INSULIN ASPART 100 UNIT/ML ~~LOC~~ SOLN: 0.0000 [IU] | Freq: Every day | SUBCUTANEOUS | Status: DC

## 2019-11-16 MED ORDER — NYSTATIN 100000 UNIT/GM EX POWD
Freq: Every day | CUTANEOUS | Status: DC | PRN
  Filled 2019-11-16: qty 15

## 2019-11-16 MED ORDER — ESCITALOPRAM OXALATE 10 MG PO TABS
10.0000 mg | ORAL_TABLET | Freq: Every day | ORAL | Status: DC
  Administered 2019-11-17 – 2019-11-19 (×3): 10 mg via ORAL
  Filled 2019-11-16 (×3): qty 1

## 2019-11-16 MED ORDER — AMLODIPINE BESYLATE 5 MG PO TABS
10.0000 mg | ORAL_TABLET | Freq: Every morning | ORAL | Status: DC
  Administered 2019-11-17 – 2019-11-19 (×3): 10 mg via ORAL
  Filled 2019-11-16 (×3): qty 2

## 2019-11-16 MED ORDER — SODIUM CHLORIDE 0.9 % IV BOLUS
1000.0000 mL | Freq: Once | INTRAVENOUS | Status: AC
  Administered 2019-11-16: 20:00:00 1000 mL via INTRAVENOUS

## 2019-11-16 MED ORDER — LORAZEPAM 1 MG PO TABS: 1.0000 mg | ORAL_TABLET | Freq: Every day | ORAL | Status: DC

## 2019-11-16 MED ORDER — HEPARIN SODIUM (PORCINE) 5000 UNIT/ML IJ SOLN
5000.0000 [IU] | Freq: Three times a day (TID) | INTRAMUSCULAR | Status: DC
  Administered 2019-11-16 – 2019-11-19 (×6): 5000 [IU] via SUBCUTANEOUS
  Filled 2019-11-16 (×6): qty 1

## 2019-11-16 MED ORDER — PANTOPRAZOLE SODIUM 20 MG PO TBEC
20.0000 mg | DELAYED_RELEASE_TABLET | Freq: Every day | ORAL | Status: DC
  Administered 2019-11-17 – 2019-11-19 (×3): 20 mg via ORAL
  Filled 2019-11-16 (×3): qty 1

## 2019-11-16 MED ORDER — HYDROCERIN EX CREA
1.0000 "application " | TOPICAL_CREAM | Freq: Two times a day (BID) | CUTANEOUS | Status: DC
  Administered 2019-11-17 – 2019-11-19 (×6): 1 via TOPICAL
  Filled 2019-11-16: qty 113

## 2019-11-16 MED ORDER — ESTRADIOL 0.1 MG/GM VA CREA
1.0000 | TOPICAL_CREAM | VAGINAL | Status: DC
  Administered 2019-11-17: 1 via VAGINAL
  Filled 2019-11-16: qty 42.5

## 2019-11-16 MED ORDER — BACITRACIN-NEOMYCIN-POLYMYXIN 400-5-5000 EX OINT: 1.0000 "application " | TOPICAL_OINTMENT | CUTANEOUS | Status: DC | PRN

## 2019-11-16 MED ORDER — VITAMIN D3 25 MCG (1000 UNIT) PO TABS
2000.0000 [IU] | ORAL_TABLET | Freq: Every day | ORAL | Status: DC
  Administered 2019-11-17 – 2019-11-19 (×2): 2000 [IU] via ORAL
  Filled 2019-11-16 (×2): qty 2

## 2019-11-16 MED ORDER — SODIUM CHLORIDE 0.9 % IV SOLN
INTRAVENOUS | Status: AC
  Administered 2019-11-16 – 2019-11-17 (×2): via INTRAVENOUS

## 2019-11-16 MED ORDER — LORATADINE 10 MG PO TABS: 10.0000 mg | ORAL_TABLET | Freq: Every day | ORAL | Status: DC | PRN

## 2019-11-16 MED ORDER — TRAMADOL HCL 50 MG PO TABS
50.0000 mg | ORAL_TABLET | Freq: Two times a day (BID) | ORAL | Status: DC
  Administered 2019-11-16 – 2019-11-19 (×6): 50 mg via ORAL
  Filled 2019-11-16 (×6): qty 1

## 2019-11-16 MED ORDER — MAGNESIUM HYDROXIDE 400 MG/5ML PO SUSP: 30.0000 mL | Freq: Every evening | ORAL | Status: DC | PRN

## 2019-11-16 MED ORDER — OLOPATADINE HCL 0.1 % OP SOLN
1.0000 [drp] | Freq: Every day | OPHTHALMIC | Status: DC | PRN
  Filled 2019-11-16: qty 5

## 2019-11-16 MED ORDER — INSULIN ASPART 100 UNIT/ML ~~LOC~~ SOLN
0.0000 [IU] | Freq: Three times a day (TID) | SUBCUTANEOUS | Status: DC
  Administered 2019-11-19: 12:00:00 1 [IU] via SUBCUTANEOUS

## 2019-11-16 MED ORDER — ESTRADIOL 0.1 MG/GM VA CREA: 1.0000 g | TOPICAL_CREAM | VAGINAL | Status: DC

## 2019-11-16 MED ORDER — ACETAMINOPHEN 650 MG RE SUPP: 650.0000 mg | Freq: Four times a day (QID) | RECTAL | Status: DC | PRN

## 2019-11-16 MED ORDER — ALUM & MAG HYDROXIDE-SIMETH 200-200-20 MG/5ML PO SUSP: 30.0000 mL | Freq: Four times a day (QID) | ORAL | Status: DC | PRN

## 2019-11-16 MED ORDER — ACETAMINOPHEN 325 MG PO TABS
650.0000 mg | ORAL_TABLET | Freq: Four times a day (QID) | ORAL | Status: DC | PRN
  Administered 2019-11-18: 650 mg via ORAL
  Filled 2019-11-16: qty 2

## 2019-11-16 MED ORDER — ACETAMINOPHEN 650 MG RE SUPP
650.0000 mg | Freq: Once | RECTAL | Status: AC
  Administered 2019-11-16: 20:00:00 650 mg via RECTAL
  Filled 2019-11-16: qty 1

## 2019-11-16 MED ORDER — ACETAMINOPHEN 325 MG PO TABS: 650.0000 mg | ORAL_TABLET | Freq: Once | ORAL | Status: DC

## 2019-11-16 NOTE — ED Notes (Signed)
Bedside report given to [facility].

## 2019-11-16 NOTE — Progress Notes (Signed)
VASCULAR LAB PRELIMINARY  PRELIMINARY  PRELIMINARY  PRELIMINARY  Left upper extremity venous duplex completed.    Preliminary report:  See CV proc for preliminary results.  Gave Dr. Darl Householder report  Mauro Kaufmann, Hardy Harcum, RVT 11/16/2019, 8:20 PM

## 2019-11-16 NOTE — H&P (Signed)
History and Physical    Katrina Burton KGM:[id number] DOB: 1936-07-16 DOA: 11/16/2019  PCP: Ron ParkerBowen, Samuel, MD Patient coming from: Nursing home  Chief Complaint: Low-grade fever, altered mental status  HPI: Katrina StallRosebud Burton is a 83 y.o. female with medical history significant of advanced dementia, type 2 diabetes, hypertension, hyperlipidemia presenting to the hospital via EMS from her nursing home for evaluation of low-grade fever (99.9 F) and altered mental status.  Noted to be less responsive today.  No cough or shortness of breath reported.  Patient was seen in the ED 3 days ago for a fall and was noted to have left upper extremity swelling.  She had x-rays done which were negative and a DVT study was ordered but never performed.   No significant history could be obtained from the patient due to her advanced dementia.  ED Course: Afebrile.  WBC count mildly elevated at 10.6.  Lactic acid normal.  UA not suggestive of infection.  Urine culture pending.  Blood culture x2 pending.  Rapid SARS-CoV-2 test negative.  Blood glucose 152.  BUN 63, creatinine 3.9.  Baseline creatinine 1.2-1.4.  Potassium 5.3, bicarb 21.  High-sensitivity troponin negative. Chest x-ray showing no active cardiopulmonary disease. Head CT negative for acute intracranial abnormalities. CT renal stone study showing no renal or ureteral stones or obstructive uropathy. Left upper extremity Doppler done in the ED negative for DVT (preliminary report was given to Dr. Silverio LayYao). Patient received Tylenol and 1 L normal saline bolus.  Review of Systems:  All systems reviewed and apart from history of presenting illness, are negative.  Past Medical History:  Diagnosis Date   Acute kidney injury (HCC)    Dementia (HCC)    Diabetes mellitus    Hyperlipidemia    Hypertension     Past Surgical History:  Procedure Laterality Date   BOTOX INJECTION N/A 06/15/2014   Procedure: BOTOX INJECTION;  Surgeon: Petra KubaMarc E Magod, MD;   [facility];  Service: Endoscopy;  Laterality: N/A;   ESOPHAGOGASTRODUODENOSCOPY (EGD) WITH PROPOFOL N/A 06/15/2014   Procedure: ESOPHAGOGASTRODUODENOSCOPY (EGD) WITH PROPOFOL;  Surgeon: Petra KubaMarc E Magod, MD;  [facility];  Service: Endoscopy;  Laterality: N/A;  h&p in file cabinet     reports that she has never smoked. She has never used smokeless tobacco. She reports that she does not drink alcohol or use drugs.  Allergies  Allergen Reactions   Bumetanide Other (See Comments)    unknown   Lasix [Furosemide] Hives   Lipitor [Atorvastatin] Hives   Other Other (See Comments)    Etacrynic acid unknown   Soap Other (See Comments)    AGENT:Certain types of soap   Statins Other (See Comments)    Hmg -Coa Reductase inhibitors   Torsemide Other (See Comments)    Unknown    History reviewed. No pertinent family history.  Prior to Admission medications   Medication Sig Start Date End Date Taking? Authorizing Provider  acetaminophen (TYLENOL) 500 MG tablet Take 500 mg by mouth every 4 (four) hours as needed for fever or headache.    Yes [provider]  alum & mag hydroxide-simeth (MAALOX/MYLANTA) 200-200-20 MG/5ML suspension Take 30 mLs by mouth every 6 (six) hours as needed for indigestion or heartburn.   Yes [provider]  amLODipine (NORVASC) 10 MG tablet Take 10 mg by mouth every morning.    Yes [provider]  carboxymethylcellul-glycerin (OPTIVE) 0.5-0.9 % ophthalmic solution Place 1 drop into both eyes 2 (two) times daily.   Yes  [provider]  cetirizine (ZYRTEC) 10 MG tablet Take 10 mg by mouth daily as needed for allergies.   Yes [provider]  Cholecalciferol (VITAMIN D3) 2000 UNITS TABS Take 2,000 Units by mouth daily.   Yes [provider]  escitalopram (LEXAPRO) 10 MG tablet Take 10 mg by mouth daily.   Yes [provider]  guaifenesin (ROBITUSSIN) 100 MG/5ML syrup Take 200 mg by  mouth every 6 (six) hours as needed for cough.   Yes [provider]  loperamide (IMODIUM) 2 MG capsule Take 2 mg by mouth as needed for diarrhea or loose stools. For diarrhea   Yes [provider]  LORazepam (ATIVAN) 0.5 MG tablet Take 2 tablets (1 mg total) by mouth at bedtime. 08/29/19  Yes Samuella Cota, MD  magnesium hydroxide (MILK OF MAGNESIA) 400 MG/5ML suspension Take 30 mLs by mouth at bedtime as needed for mild constipation.   Yes [provider]  metFORMIN (GLUCOPHAGE) 1000 MG tablet Take 1,000 mg by mouth 2 (two) times daily with a meal.   Yes [provider]  Neomycin-Bacitracin-Polymyxin (TRIPLE ANTIBIOTIC) [phone] OINT Apply 1 application topically as needed (Minor skin tears or abrasions).   Yes [provider]  nystatin (NYSTATIN) powder Apply 1 Bottle topically daily as needed (Apply to Groin).    Yes [provider]  OLANZapine zydis (ZYPREXA) 10 MG disintegrating tablet Take 10 mg by mouth at bedtime.   Yes [provider]  olopatadine (PATANOL) 0.1 % ophthalmic solution Place 1 drop into both eyes daily as needed for allergies.   Yes [provider]  pantoprazole (PROTONIX) 20 MG tablet Take 20 mg by mouth daily.    Yes [provider]  Skin Protectants, Misc. (BAZA PROTECT EX) Apply 1 application topically See admin instructions. Every shift   Yes [provider]  Skin Protectants, Misc. (MINERIN CREME) CREA Apply 1 application topically 2 (two) times daily. 06AM and 1800 Apply to total body   Yes [provider]  spironolactone (ALDACTONE) 25 MG tablet Take 25 mg by mouth daily.   Yes [provider]  traMADol (ULTRAM) 50 MG tablet Take 1 tablet (50 mg total) by mouth 2 (two) times daily. Foot pain. 08/29/19  Yes Samuella Cota, MD  estradiol (ESTRACE) 0.1 MG/GM vaginal cream Place 1 g vaginally once a week. 10/06/19   [provider]  insulin aspart  (NOVOLOG) 100 UNIT/ML injection Inject 6 Units into the skin as needed for high blood sugar. Sliding scale BS 0.350 = 0 unitrs If blood sugar is greater than 350 give 6 units FSBS every Monday, Wednesday and Friday    [provider]    Physical Exam: Vitals:   11/16/19 1831 11/16/19 1900 11/16/19 2100  BP: (!) 115/52 (!) 114/56 134/72  Pulse: 72 65 75  Resp: 20 19 19   Temp: 99.3 F (37.4 C)    TempSrc: Rectal    SpO2: 97% 92% 95%    Physical Exam  Constitutional: She appears well-developed and well-nourished. No distress.  HENT:  Head: Normocephalic.  Eyes: Right eye exhibits no discharge. Left eye exhibits no discharge.  Neck: Neck supple.  Cardiovascular: Normal rate, regular rhythm and intact distal pulses.  Pulmonary/Chest: Effort normal and breath sounds normal. No respiratory distress. She has no wheezes. She has no rales.  Abdominal: Soft. Bowel sounds are normal. She exhibits no distension. There is no abdominal tenderness. There is no guarding.  Musculoskeletal:  General: No edema.  Neurological:  Awake and alert Oriented to self only  Skin: Skin is warm and dry. She is not diaphoretic.     Labs on Admission: I have personally reviewed following labs and imaging studies  CBC: Recent Labs  Lab 11/16/19 1847  WBC 10.6*  NEUTROABS 6.7  HGB 10.8*  HCT 35.1*  MCV 96.2  PLT 279   Basic Metabolic Panel: Recent Labs  Lab 11/16/19 1847  NA 136  K 5.3*  CL 106  CO2 21*  GLUCOSE 152*  BUN 63*  CREATININE 3.96*  CALCIUM 10.1   GFR: CrCl cannot be calculated (Unknown ideal weight.). Liver Function Tests: Recent Labs  Lab 11/16/19 1847  AST 33  ALT 19  ALKPHOS 73  BILITOT 0.5  PROT 8.3*  ALBUMIN 4.0   No results for input(s): LIPASE, AMYLASE in the last 168 hours. No results for input(s): AMMONIA in the last 168 hours. Coagulation Profile: No results for input(s): INR, PROTIME in the last 168 hours. Cardiac Enzymes: No results  for input(s): CKTOTAL, CKMB, CKMBINDEX, TROPONINI in the last 168 hours. BNP (last 3 results) No results for input(s): PROBNP in the last 8760 hours. HbA1C: No results for input(s): HGBA1C in the last 72 hours. CBG: No results for input(s): GLUCAP in the last 168 hours. Lipid Profile: No results for input(s): CHOL, HDL, LDLCALC, TRIG, CHOLHDL, LDLDIRECT in the last 72 hours. Thyroid Function Tests: No results for input(s): TSH, T4TOTAL, FREET4, T3FREE, THYROIDAB in the last 72 hours. Anemia Panel: No results for input(s): VITAMINB12, FOLATE, FERRITIN, TIBC, IRON, RETICCTPCT in the last 72 hours. Urine analysis:    Component Value Date/Time   COLORURINE YELLOW 11/16/2019 1858   APPEARANCEUR CLEAR 11/16/2019 1858   LABSPEC 1.018 11/16/2019 1858   PHURINE 5.0 11/16/2019 1858   GLUCOSEU NEGATIVE 11/16/2019 1858   HGBUR NEGATIVE 11/16/2019 1858   BILIRUBINUR NEGATIVE 11/16/2019 1858   KETONESUR NEGATIVE 11/16/2019 1858   PROTEINUR NEGATIVE 11/16/2019 1858   UROBILINOGEN 1.0 10/09/2014 2005   NITRITE NEGATIVE 11/16/2019 1858   LEUKOCYTESUR NEGATIVE 11/16/2019 1858    Radiological Exams on Admission: Ct Head Wo Contrast  Result Date: 11/16/2019 CLINICAL DATA:  Pt arrives [facility] from [facility] with complaints of AMS since yesterday. Facility staff reports that the pt is more altered than normal. Pt has advanced dementia at baseline. Reports low grade fever of 99.91f. No cough or SHOB noted. EXAM: CT HEAD WITHOUT CONTRAST TECHNIQUE: Contiguous axial images were obtained from the base of the skull through the vertex without intravenous contrast. COMPARISON:  08/25/2019 FINDINGS: Brain: No evidence of acute infarction, hemorrhage, hydrocephalus, extra-axial collection or mass lesion/mass effect. Mild periventricular white matter hypoattenuation is noted consistent with chronic microvascular ischemic change. Vascular: No hyperdense vessel or unexpected calcification. Skull: Normal. Negative  for fracture or focal lesion. Sinuses/Orbits: Globes and orbits are unremarkable. Visualized sinuses and mastoid air cells are clear. Other: None. IMPRESSION: 1. No acute intracranial abnormalities. 2. Mild chronic microvascular ischemic change. Electronically Signed   By: Amie Portland M.D.   On: 11/16/2019 20:02   Dg Chest Port 1 View  Result Date: 11/16/2019 CLINICAL DATA:  Pt arrives [facility] from [facility] with complaints of AMS since yesterday. Facility staff reports that the pt is more altered than normal. Pt has advanced dementia at baseline. Reports low grade fever of 99.66f. No cough or SHOB noted. HTN, DM, dementia EXAM: PORTABLE CHEST 1 VIEW COMPARISON:  08/25/2019 FINDINGS: Cardiac silhouette mildly enlarged. No mediastinal or  hilar masses. Mild opacity at the right lung base which is likely atelectasis. Lungs otherwise clear. No convincing pleural effusion and no pneumothorax. Skeletal structures are grossly intact. IMPRESSION: No acute cardiopulmonary disease. Electronically Signed   By: Amie Portland M.D.   On: 11/16/2019 19:53   Ct Renal Stone Study  Result Date: 11/16/2019 CLINICAL DATA:  Flank pain. Renal stone disease suspected. Altered mental status since yesterday. EXAM: CT ABDOMEN AND PELVIS WITHOUT CONTRAST TECHNIQUE: Multidetector CT imaging of the abdomen and pelvis was performed following the standard protocol without IV contrast. COMPARISON:  08/25/2019 FINDINGS: Lower chest: No acute abnormality. Hepatobiliary: No focal liver abnormality is seen. No gallstones, gallbladder wall thickening, or biliary dilatation. Pancreas: Unremarkable. No pancreatic ductal dilatation or surrounding inflammatory changes. Spleen: Normal in size without focal abnormality. Adrenals/Urinary Tract: No adrenal masses. Kidneys normal in overall size, orientation and position. Subcentimeter hyperattenuating cortical lesion from the upper pole the right kidney is likely a small hemorrhagic cyst but too  small to characterize. No other renal masses, no stones and no hydronephrosis. Ureters normal in course and in caliber. Bladder is unremarkable. Stomach/Bowel: Stomach is unremarkable. Small bowel and colon are normal in caliber. No wall thickening or inflammation. No evidence of appendicitis. Vascular/Lymphatic: Minimal aortic atherosclerosis. No aneurysm. No enlarged lymph nodes. Reproductive: Status post hysterectomy. No adnexal masses. Other: No abdominal wall hernia or abnormality. No abdominopelvic ascites. Musculoskeletal: No fracture or acute finding. No osteoblastic or osteolytic lesions. Degenerative changes noted throughout the visualized spine. IMPRESSION: 1. No acute findings. No renal or ureteral stones or obstructive uropathy. 2. Mild aortic atherosclerosis. Status post hysterectomy. Electronically Signed   By: Amie Portland M.D.   On: 11/16/2019 20:07    Assessment/Plan Principal Problem:   Acute renal failure (ARF) (HCC) Active Problems:   Hypertension   DM (diabetes mellitus) (HCC)   Acute metabolic encephalopathy   CKD (chronic kidney disease) stage 3, GFR 30-59 ml/min   AKI on CKD stage III BUN 63, creatinine 3.9.  Baseline creatinine 1.2-1.4.  Potassium 5.3, bicarb 21.  AKI likely prerenal from dehydration and home spironolactone use.  CT showing no renal or ureteral stones or obstructive uropathy. -IV fluid hydration -Continue to monitor renal function -Monitor urine output -Avoid nephrotoxic agents/contrast -Urine sodium, creatinine  Acute metabolic encephalopathy Suspect due to uremia in the setting of acute renal failure.  No infectious etiology identified so far.  Head CT negative for acute intracranial abnormality.  Patient is currently awake and alert and resting comfortably. -Management of AKI as mentioned above -Check ammonia, TSH, and B12 levels  Mild leukocytosis Afebrile.  WBC count mildly elevated at 10.6.  Lactic acid normal.  No signs of sepsis.  UA not  suggestive of infection.  Chest x-ray not suggestive of pneumonia.  Rapid SARS-CoV-2 test negative. -Urine culture pending -Continue to monitor WBC count  Insulin-dependent diabetes mellitus -Check A1c.  Sliding scale insulin and CBG checks.  Depression/mood disorder -Continue Lexapro, Zyprexa  Hypertension -Continue amlodipine  Chronic pain -Continue tramadol -Tylenol as needed  DVT prophylaxis: Subcutaneous heparin Code Status: Full code Family Communication: No available. Disposition Plan: Anticipate discharge after clinical improvement. Consults called: None Admission status: It is my clinical opinion that admission to INPATIENT is reasonable and necessary in this 83 y.o. female  presenting with acute renal failure.  Patient will need IV fluid hydration and continued monitoring of renal function for the next several days.  Given the aforementioned, the predictability of an adverse outcome is felt to  be significant. I expect that the patient will require at least 2 midnights in the hospital to treat this condition.   The medical decision making on this patient was of high complexity and the patient is at high risk for clinical deterioration, therefore this is a level 3 visit.  John Giovanni MD [facility] Pager [phone]  If 7PM-7AM, please contact night-coverage [email or web address] Password [facility]  11/16/2019, 9:26 PM

## 2019-11-16 NOTE — ED Triage Notes (Signed)
Pt arrives GEMS from [facility] with complaints of AMS since yesterday. Facility staff reports that the pt is more altered than normal. Pt has advanced dementia at baseline. Reports low grade fever of 99.23f. No cough or SHOB noted.

## 2019-11-16 NOTE — ED Provider Notes (Signed)
[facility] DEPT Provider Note   CSN: [id number] Arrival date & time: 11/16/19  1726     History   Chief Complaint Chief Complaint  Patient presents with   Altered Mental Status    HPI Katrina Burton is a 83 y.o. female hx of AKI, dementia, DM, HL, HTN here presenting with altered mental status, low-grade temp.  Patient was seen here 3 days ago for a fall and was noted to have left upper extremity swelling.  Patient had x-rays done that was negative and DVT study that was ordered but was never performed.  Patient was noted to be less responsive today as well as having low-grade temperature of 99 .  Patient is demented at baseline and unable to give much history.     The history is provided by the patient and the nursing home.  Level V caveat- AMS, dementia   Past Medical History:  Diagnosis Date   Acute kidney injury ([place])    Dementia ([place])    Diabetes mellitus    Hyperlipidemia    Hypertension     Patient Active Problem List   Diagnosis Date Noted   Diarrhea 08/27/2019   Hypomagnesemia 08/27/2019   Febrile illness 08/26/2019   DKA (diabetic ketoacidoses) ([place]) 12/12/2013   Hyponatremia 12/12/2013   Acute kidney injury ([place]) 12/12/2013   UTI (lower urinary tract infection) 04/29/2013   DM (diabetes mellitus) ([place]) 04/29/2013   Hypertension    Dementia ([place])     Past Surgical History:  Procedure Laterality Date   BOTOX INJECTION N/A 06/15/2014   Procedure: BOTOX INJECTION;  Surgeon: Jeryl Columbia, MD;  Location: [facility] ENDOSCOPY;  Service: Endoscopy;  Laterality: N/A;   ESOPHAGOGASTRODUODENOSCOPY (EGD) WITH PROPOFOL N/A 06/15/2014   Procedure: ESOPHAGOGASTRODUODENOSCOPY (EGD) WITH PROPOFOL;  Surgeon: Jeryl Columbia, MD;  Location: [facility] ENDOSCOPY;  Service: Endoscopy;  Laterality: N/A;  h&p in file cabinet     OB History   No obstetric history on file.      Home Medications    Prior to Admission medications     Medication Sig Start Date End Date Taking? Authorizing Provider  acetaminophen (TYLENOL) 500 MG tablet Take 500 mg by mouth every 4 (four) hours as needed for fever or headache.    Yes [provider]  alum & mag hydroxide-simeth (MAALOX/MYLANTA) 200-200-20 MG/5ML suspension Take 30 mLs by mouth every 6 (six) hours as needed for indigestion or heartburn.   Yes [provider]  amLODipine (NORVASC) 10 MG tablet Take 10 mg by mouth every morning.    Yes [provider]  carboxymethylcellul-glycerin (OPTIVE) 0.5-0.9 % ophthalmic solution Place 1 drop into both eyes 2 (two) times daily.   Yes [provider]  cetirizine (ZYRTEC) 10 MG tablet Take 10 mg by mouth daily as needed for allergies.   Yes [provider]  Cholecalciferol (VITAMIN D3) 2000 UNITS TABS Take 2,000 Units by mouth daily.   Yes [provider]  escitalopram (LEXAPRO) 10 MG tablet Take 10 mg by mouth daily.   Yes [provider]  guaifenesin (ROBITUSSIN) 100 MG/5ML syrup Take 200 mg by mouth every 6 (six) hours as needed for cough.   Yes [provider]  loperamide (IMODIUM) 2 MG capsule Take 2 mg by mouth as needed for diarrhea or loose stools. For diarrhea   Yes [provider]  LORazepam (ATIVAN) 0.5 MG tablet Take 2 tablets (1 mg total) by mouth at bedtime. 08/29/19  Yes Samuella Cota, MD  magnesium hydroxide (MILK OF MAGNESIA) 400 MG/5ML suspension Take 30 mLs by mouth at bedtime as needed for mild constipation.   Yes [provider]  metFORMIN (GLUCOPHAGE) 1000 MG tablet Take 1,000 mg by mouth 2 (two) times daily with a meal.   Yes [provider]  Neomycin-Bacitracin-Polymyxin (TRIPLE ANTIBIOTIC) [phone] OINT Apply 1 application topically as needed (Minor skin tears or abrasions).   Yes [provider]  nystatin (NYSTATIN) powder Apply 1 Bottle topically daily as needed (Apply to Groin).    Yes [provider]  OLANZapine zydis (ZYPREXA) 10 MG disintegrating tablet Take 10 mg by mouth at bedtime.   Yes [provider]  olopatadine (PATANOL) 0.1 % ophthalmic solution Place 1 drop into both eyes daily as needed for allergies.   Yes [provider]  pantoprazole (PROTONIX) 20 MG tablet Take 20 mg by mouth daily.    Yes [provider]  Skin Protectants, Misc. (BAZA PROTECT EX) Apply 1 application topically See admin instructions. Every shift   Yes [provider]  Skin Protectants, Misc. (MINERIN CREME) CREA Apply 1 application topically 2 (two) times daily. 06AM and 1800 Apply to total body   Yes [provider]  spironolactone (ALDACTONE) 25 MG tablet Take 25 mg by mouth daily.   Yes [provider]  traMADol (ULTRAM) 50 MG tablet Take 1 tablet (50 mg total) by mouth 2 (two) times daily. Foot pain. 08/29/19  Yes Standley Brooking, MD  estradiol (ESTRACE) 0.1 MG/GM vaginal cream Place 1 g vaginally once a week. 10/06/19   [provider]  insulin aspart (NOVOLOG) 100 UNIT/ML injection Inject 6 Units into the skin as needed for high blood sugar. Sliding scale BS 0.350 = 0 unitrs If blood sugar is greater than 350 give 6 units FSBS every Monday, Wednesday and Friday    [provider]    Family History History reviewed. No pertinent family history.  Social History Social History   Tobacco Use   Smoking status: Never Smoker   Smokeless tobacco: Never Used  Substance Use Topics   Alcohol use: No   Drug use: No     Allergies   Bumetanide, Lasix [furosemide], Lipitor [atorvastatin], Other, Soap, Statins, and Torsemide   Review of Systems Review of Systems  Neurological: Positive for weakness.  All other systems reviewed and are negative.    Physical Exam Updated Vital Signs BP (!) 114/56 (BP Location: Right Arm)    Pulse 65    Temp 99.3 F (37.4 C) (Rectal)    Resp 19    SpO2 92%   Physical Exam Vitals  signs and nursing note reviewed.  Constitutional:      Comments: Demented, altered   HENT:     Head: Normocephalic.     Nose: Nose normal.     Mouth/Throat:     Mouth: Mucous membranes are moist.  Eyes:     Extraocular Movements: Extraocular movements intact.     Pupils: Pupils are equal, round, and reactive to light.  Neck:     Musculoskeletal: Normal range of motion.  Cardiovascular:     Rate and Rhythm: Normal rate and regular rhythm.     Pulses: Normal pulses.     Heart sounds: Normal heart sounds.  Pulmonary:     Effort: Pulmonary effort is normal.     Breath sounds: Normal breath sounds.  Abdominal:     General: Abdomen is flat.     Palpations: Abdomen is soft.  Musculoskeletal:     Comments: Mild L upper extremity swelling, good radial pulse, no obvious ecchymosis or deformity   Skin:    General: Skin is warm.     Capillary Refill: Capillary refill takes less than 2 seconds.  Neurological:     General: No focal deficit present.     Comments: Demented, moving all extremities   Psychiatric:        Mood and Affect: Mood normal.        Behavior: Behavior normal.      ED Treatments / Results  Labs (all labs ordered are listed, but only abnormal results are displayed) Labs Reviewed  CBC WITH DIFFERENTIAL/PLATELET - Abnormal; Notable for the following components:      Result Value   WBC 10.6 (*)    RBC 3.65 (*)    Hemoglobin 10.8 (*)    HCT 35.1 (*)    All other components within normal limits  COMPREHENSIVE METABOLIC PANEL - Abnormal; Notable for the following components:   Potassium 5.3 (*)    CO2 21 (*)    Glucose, Bld 152 (*)    BUN 63 (*)    Creatinine, Ser 3.96 (*)    Total Protein 8.3 (*)    GFR calc non Af Amer 10 (*)    GFR calc Af Amer 12 (*)    All other components within normal limits  CULTURE, BLOOD (ROUTINE X 2)  CULTURE, BLOOD (ROUTINE X 2)  URINE CULTURE  LACTIC ACID, PLASMA  URINALYSIS, ROUTINE W REFLEX MICROSCOPIC  LACTIC ACID, PLASMA    POC SARS CORONAVIRUS 2 AG -  ED  TROPONIN I (HIGH SENSITIVITY)  TROPONIN I (HIGH SENSITIVITY)    EKG None  Radiology Ct Head Wo Contrast  Result Date: 11/16/2019 CLINICAL DATA:  [facility]. Facility staff reports that the pt is more altered than normal. Pt has advanced dementia at baseline. Reports low grade fever of 99.3812f. No cough or SHOB noted. EXAM: CT HEAD WITHOUT CONTRAST TECHNIQUE: Contiguous axial images were obtained from the base of the skull through the vertex without intravenous contrast. COMPARISON:  08/25/2019 FINDINGS: Brain: No evidence of acute infarction, hemorrhage, hydrocephalus, extra-axial collection or mass lesion/mass effect. Mild periventricular white matter hypoattenuation is noted consistent with chronic microvascular ischemic change. Vascular: No hyperdense vessel or unexpected calcification. Skull: Normal. Negative for fracture or focal lesion. Sinuses/Orbits: Globes and orbits are unremarkable. Visualized sinuses and mastoid air cells are clear. Other: None. IMPRESSION: 1. No acute intracranial abnormalities. 2. Mild chronic microvascular ischemic change. Electronically Signed   By: Amie Portlandavid  Ormond M.D.   On: 11/16/2019 20:02   Dg Chest Port 1 View  Result Date: 11/16/2019 CLINICAL DATA:  [facility] since [facility]. Facility staff reports that the pt is more altered than normal. Pt has advanced dementia at baseline. Reports low grade fever of 99.9712f. No cough or SHOB noted. HTN, DM, dementia EXAM: PORTABLE CHEST 1 VIEW COMPARISON:  08/25/2019 FINDINGS: Cardiac silhouette mildly enlarged. No mediastinal or hilar masses. Mild opacity at the right lung base which is likely atelectasis. Lungs otherwise clear. No convincing pleural effusion and no pneumothorax. Skeletal structures are grossly intact. IMPRESSION: No acute cardiopulmonary disease. Electronically  Signed   By: Amie Portlandavid  Ormond M.D.   On: 11/16/2019 19:53   Ct Renal Stone Study  Result Date: 11/16/2019 CLINICAL DATA:  Flank pain. Renal stone disease suspected. Altered mental status  since yesterday. EXAM: CT ABDOMEN AND PELVIS WITHOUT CONTRAST TECHNIQUE: Multidetector CT imaging of the abdomen and pelvis was performed following the standard protocol without IV contrast. COMPARISON:  08/25/2019 FINDINGS: Lower chest: No acute abnormality. Hepatobiliary: No focal liver abnormality is seen. No gallstones, gallbladder wall thickening, or biliary dilatation. Pancreas: Unremarkable. No pancreatic ductal dilatation or surrounding inflammatory changes. Spleen: Normal in size without focal abnormality. Adrenals/Urinary Tract: No adrenal masses. Kidneys normal in overall size, orientation and position. Subcentimeter hyperattenuating cortical lesion from the upper pole the right kidney is likely a small hemorrhagic cyst but too small to characterize. No other renal masses, no stones and no hydronephrosis. Ureters normal in course and in caliber. Bladder is unremarkable. Stomach/Bowel: Stomach is unremarkable. Small bowel and colon are normal in caliber. No wall thickening or inflammation. No evidence of appendicitis. Vascular/Lymphatic: Minimal aortic atherosclerosis. No aneurysm. No enlarged lymph nodes. Reproductive: Status post hysterectomy. No adnexal masses. Other: No abdominal wall hernia or abnormality. No abdominopelvic ascites. Musculoskeletal: No fracture or acute finding. No osteoblastic or osteolytic lesions. Degenerative changes noted throughout the visualized spine. IMPRESSION: 1. No acute findings. No renal or ureteral stones or obstructive uropathy. 2. Mild aortic atherosclerosis. Status post hysterectomy. Electronically Signed   By: Amie [place] M.D.   On: 11/16/2019 20:07    Procedures Procedures (including critical care time)  Medications Ordered in ED Medications  sodium chloride 0.9 % bolus  1,000 mL (has no administration in time range)  acetaminophen (TYLENOL) suppository 650 mg (has no administration in time range)     Initial Impression / Assessment and Plan / ED Course  I have reviewed the triage vital signs and the nursing notes.  Pertinent labs & imaging results that were available during my care of the patient were reviewed by me and considered in my medical decision making (see chart for details).       Katrina Burton is a 83 y.o. female here with AMS, weakness. Recently seen in the ED for fall. Has low grade temp and AMS. Will do sepsis workup with labs, lactate, cultures, UA, CXR. Will get CT head and COVID as well   8:30 PM CT head unremarkable. Cr up to 3.9. Was 1.6 during recent admission. CT renal stone unremarkable. CXR and UA negative. AMS likely from uremia. Given IVF. Will admit.      Final Clinical Impressions(s) / ED Diagnoses   Final diagnoses:  None    ED Discharge Orders    None       Charlynne Pander, MD 11/16/19 2031

## 2019-11-17 DIAGNOSIS — E119 Type 2 diabetes mellitus without complications: Secondary | ICD-10-CM

## 2019-11-17 DIAGNOSIS — G9341 Metabolic encephalopathy: Secondary | ICD-10-CM

## 2019-11-17 DIAGNOSIS — I10 Essential (primary) hypertension: Secondary | ICD-10-CM

## 2019-11-17 DIAGNOSIS — F0390 Unspecified dementia without behavioral disturbance: Secondary | ICD-10-CM

## 2019-11-17 DIAGNOSIS — N1832 Chronic kidney disease, stage 3b: Secondary | ICD-10-CM

## 2019-11-17 LAB — CREATININE, URINE, RANDOM: Creatinine, Urine: 298.42 mg/dL

## 2019-11-17 LAB — GLUCOSE, CAPILLARY
Glucose-Capillary: 100 mg/dL — ABNORMAL HIGH (ref 70–99)
Glucose-Capillary: 117 mg/dL — ABNORMAL HIGH (ref 70–99)
Glucose-Capillary: 91 mg/dL (ref 70–99)
Glucose-Capillary: 130 mg/dL — ABNORMAL HIGH (ref 70–99)

## 2019-11-17 LAB — CBC
HCT: 35.4 % — ABNORMAL LOW (ref 36.0–46.0)
Hemoglobin: 11.1 g/dL — ABNORMAL LOW (ref 12.0–15.0)
MCH: 30.2 pg (ref 26.0–34.0)
MCHC: 31.4 g/dL (ref 30.0–36.0)
MCV: 96.5 fL (ref 80.0–100.0)
Platelets: 271 10*3/uL (ref 150–400)
RBC: 3.67 MIL/uL — ABNORMAL LOW (ref 3.87–5.11)
RDW: 12.9 % (ref 11.5–15.5)
WBC: 10 10*3/uL (ref 4.0–10.5)
nRBC: 0 % (ref 0.0–0.2)

## 2019-11-17 LAB — BASIC METABOLIC PANEL
Anion gap: 9 (ref 5–15)
BUN: 44 mg/dL — ABNORMAL HIGH (ref 8–23)
CO2: 19 mmol/L — ABNORMAL LOW (ref 22–32)
Calcium: 9.2 mg/dL (ref 8.9–10.3)
Chloride: 109 mmol/L (ref 98–111)
Creatinine, Ser: 2.32 mg/dL — ABNORMAL HIGH (ref 0.44–1.00)
GFR calc Af Amer: 22 mL/min — ABNORMAL LOW (ref >60)
GFR calc non Af Amer: 19 mL/min — ABNORMAL LOW (ref >60)
Glucose, Bld: 111 mg/dL — ABNORMAL HIGH (ref 70–99)
Potassium: 4.9 mmol/L (ref 3.5–5.1)
Sodium: 137 mmol/L (ref 135–145)

## 2019-11-17 LAB — HEMOGLOBIN A1C
Hgb A1c MFr Bld: 6.4 % — ABNORMAL HIGH (ref 4.8–5.6)
Mean Plasma Glucose: 136.98 mg/dL

## 2019-11-17 LAB — URINE CULTURE: Culture: NO GROWTH

## 2019-11-17 LAB — TSH: TSH: 1.645 u[IU]/mL (ref 0.350–4.500)

## 2019-11-17 LAB — VITAMIN B12: Vitamin B-12: 285 pg/mL (ref 180–914)

## 2019-11-17 LAB — SODIUM, URINE, RANDOM: Sodium, Ur: 91 mmol/L

## 2019-11-17 LAB — SARS CORONAVIRUS 2 (TAT 6-24 HRS): SARS Coronavirus 2: NEGATIVE

## 2019-11-17 LAB — MRSA PCR SCREENING: MRSA by PCR: NEGATIVE

## 2019-11-17 MED ORDER — SODIUM CHLORIDE 0.9 % IV SOLN
INTRAVENOUS | Status: AC
  Administered 2019-11-17 – 2019-11-19 (×3): via INTRAVENOUS

## 2019-11-17 NOTE — Progress Notes (Signed)
PROGRESS NOTE    Katrina Burton  XFG:[id number] DOB: 1936-08-17 DOA: 11/16/2019 PCP: Sande Brothers, MD    Brief Narrative:  Katrina Burton is a 83 y.o. female with medical history significant of advanced dementia, type 2 diabetes, hypertension, hyperlipidemia presenting to the hospital via EMS from her nursing home for evaluation of low-grade fever (99.9 F) and altered mental status.  Noted to be less responsive today.  No cough or shortness of breath reported.  Patient was seen in the ED 3 days ago for a fall and was noted to have left upper extremity swelling.  She had x-rays done which were negative and a DVT study was ordered but never performed.   No significant history could be obtained from the patient due to her advanced dementia.  ED Course: Afebrile.  WBC count mildly elevated at 10.6.  Lactic acid normal.  UA not suggestive of infection.  Urine culture pending.  Blood culture x2 pending.  Rapid SARS-CoV-2 test negative.  Blood glucose 152.  BUN 63, creatinine 3.9.  Baseline creatinine 1.2-1.4.  Potassium 5.3, bicarb 21. High-sensitivity troponin negative. Chest x-ray showing no active cardiopulmonary disease. Head CT negative for acute intracranial abnormalities. CT renal stone study showing no renal or ureteral stones or obstructive uropathy. Left upper extremity Doppler done in the ED negative for DVT (preliminary report was given to Dr. Darl Householder). Patient received Tylenol and 1 L normal saline bolus.   Assessment & Plan:   Principal Problem:   Acute renal failure (ARF) (HCC) Active Problems:   Hypertension   DM (diabetes mellitus) (HCC)   Acute metabolic encephalopathy   CKD (chronic kidney disease) stage 3, GFR 30-59 ml/min   AKI on CKD stage III BUN 63, creatinine 3.9.  Baseline creatinine 1.2-1.4.  Potassium 5.3, bicarb 21.  AKI likely prerenal from dehydration and home spironolactone use.  CT showing no renal or ureteral stones or obstructive uropathy. --Cr  4.96-->2.32 --Urine sodium and creatinine: Pending --Continue IV fluid hydration --Closely monitor urine output --Avoid nephrotoxins, renally dose all medications --monitor renal function  Acute metabolic encephalopathy Hx Dementia Suspect due to uremia in the setting of acute renal failure.  Likely also complicated by history of dementia. No infectious etiology identified.  Head CT negative for acute intracranial abnormality.  Patient is currently awake and alert and resting comfortably.  Ammonia level 30, within normal limits.  TSH 1.645 and vitamin B12 within normal limits. --Management of AKI as above   Adult failure to thrive Back patient with poor oral intake.  Likely progressing dementia.  Discussed with daughter via telephone today, may consider hospice versus palliative care following on discharge. --Will consult health care for assistance with goals of care and medical decision making  Mild leukocytosis Afebrile.  WBC count mildly elevated at 10.6.  Lactic acid normal.  No signs of sepsis.  UA not suggestive of infection.  Chest x-ray not suggestive of pneumonia.  SARS-CoV-2 test negative --Urine culture pending --Continue to monitor WBC count  Insulin-dependent diabetes mellitus Hemoglobin A1c 6.4, well controlled.   --Sliding scale insulin and CBG checks.  Depression/mood disorder --Continue Lexapro, Zyprexa  Hypertension --Continue amlodipine  Chronic pain --Continue tramadol --Tylenol as needed   DVT prophylaxis: Heparin Code Status: Full code Family Communication: Updated patient's daughter, Katrina Burton by telephone this afternoon Disposition Plan: Continue inpatient, from [facility] memory care unit; further depending on clinical course   Consultants:   Palliative care  Procedures:   Vascular duplex ultrasound bilateral upper extremities: Summary:  Right: No evidence of thrombosis in the subclavian.   Left: No evidence of deep vein  thrombosis in the upper extremity. No evidence of superficial vein thrombosis in the upper extremity.  Antimicrobials:   none   Subjective: Patient seen and examined at bedside, resting comfortably, pleasantly confused.  No complaints this morning.  No family present at bedside.  Updated patient's daughter, Katrina Burton via telephone this afternoon.  Patient denies headache, no chest pain, no shortness of breath, no nausea cefonicid/diarrhea, no fever/chills/night sweats, no palpitations, no abdominal pain, no paresthesias.  No acute events overnight per nursing staff.  Objective: Vitals:   11/16/19 2200 11/16/19 2222 11/17/19 0615 11/17/19 1310  BP: 120/62 134/65 126/60 (!) 147/71  Pulse: 64 67 60 62  Resp: 20 20 20 17   Temp:  98.2 F (36.8 C) 97.6 F (36.4 C) 97.7 F (36.5 C)  TempSrc:  Oral Oral Oral  SpO2: 94% 98% 96% 96%  Weight:  85.9 kg      Intake/Output Summary (Last 24 hours) at 11/17/2019 1947 Last data filed at 11/17/2019 1730 Gross per 24 hour  Intake 2445.33 ml  Output 1550 ml  Net 895.33 ml   Filed Weights   11/16/19 2222  Weight: 85.9 kg    Examination:  General exam: Appears calm and comfortable, no acute distress, pleasantly confused Respiratory system: Clear to auscultation. Respiratory effort normal. Cardiovascular system: S1 & S2 heard, RRR. No JVD, murmurs, rubs, gallops or clicks. No pedal edema. Gastrointestinal system: Abdomen is nondistended, soft and nontender. No organomegaly or masses felt. Normal bowel sounds heard. Central nervous system: Alert, oriented that she is in the hospital, year is 2012, president is Camera operatorBush. No focal neurological deficits. Extremities: Symmetric 5 x 5 power. Skin: No rashes, lesions or ulcers Psychiatry: Judgement and insight appear poor. Mood & affect appropriate.     Data Reviewed: I have personally reviewed following labs and imaging studies  CBC: Recent Labs  Lab 11/16/19 1847 11/17/19 0535  WBC 10.6* 10.0   NEUTROABS 6.7  --   HGB 10.8* 11.1*  HCT 35.1* 35.4*  MCV 96.2 96.5  PLT 279 271   Basic Metabolic Panel: Recent Labs  Lab 11/16/19 1847 11/17/19 0535  NA 136 137  K 5.3* 4.9  CL 106 109  CO2 21* 19*  GLUCOSE 152* 111*  BUN 63* 44*  CREATININE 3.96* 2.32*  CALCIUM 10.1 9.2   GFR: Estimated Creatinine Clearance: 20.2 mL/min (A) (by C-G formula based on SCr of 2.32 mg/dL (H)). Liver Function Tests: Recent Labs  Lab 11/16/19 1847  AST 33  ALT 19  ALKPHOS 73  BILITOT 0.5  PROT 8.3*  ALBUMIN 4.0   No results for input(s): LIPASE, AMYLASE in the last 168 hours. Recent Labs  Lab 11/16/19 2320  AMMONIA 10   Coagulation Profile: No results for input(s): INR, PROTIME in the last 168 hours. Cardiac Enzymes: No results for input(s): CKTOTAL, CKMB, CKMBINDEX, TROPONINI in the last 168 hours. BNP (last 3 results) No results for input(s): PROBNP in the last 8760 hours. HbA1C: Recent Labs    11/16/19 2320  HGBA1C 6.4*   CBG: Recent Labs  Lab 11/17/19 0733 11/17/19 1139 11/17/19 1624  GLUCAP 100* 117* 91   Lipid Profile: No results for input(s): CHOL, HDL, LDLCALC, TRIG, CHOLHDL, LDLDIRECT in the last 72 hours. Thyroid Function Tests: Recent Labs    11/16/19 2320  TSH 1.645   Anemia Panel: Recent Labs    11/16/19 2320  VITAMINB12 285  Sepsis Labs: Recent Labs  Lab 11/16/19 1849 11/16/19 2004  LATICACIDVEN 1.5 1.3    Recent Results (from the past 240 hour(s))  Blood culture (routine x 2)     Status: None (Preliminary result)   Collection Time: 11/16/19  6:04 PM   Specimen: BLOOD  Result Value Ref Range Status   Specimen Description   Final    BLOOD LEFT WRIST Performed at [facility], [address], [place], [place] [address]    Special Requests   Final    BOTTLES DRAWN AEROBIC AND ANAEROBIC Blood Culture adequate volume Performed at [facility], [address], [place], [place] [address]     Culture   Final    NO GROWTH < 12 HOURS Performed at [facility], [address] [place], [place] [address]    Report Status PENDING  Incomplete  Blood culture (routine x 2)     Status: None (Preliminary result)   Collection Time: 11/16/19  6:09 PM   Specimen: BLOOD  Result Value Ref Range Status   Specimen Description   Final    BLOOD RIGHT WRIST Performed at [facility], [address] [place], [place] [address]    Special Requests   Final    BOTTLES DRAWN AEROBIC ONLY Blood Culture results may not be optimal due to an inadequate volume of blood received in culture bottles Performed at [facility], [address], [place], [place] [address]    Culture   Final    NO GROWTH < 12 HOURS Performed at [facility], [address] [place], [place] [address]    Report Status PENDING  Incomplete  SARS CORONAVIRUS 2 (TAT 6-24 HRS) Nasopharyngeal Nasopharyngeal Swab     Status: None   Collection Time: 11/16/19  8:13 PM   Specimen: Nasopharyngeal Swab  Result Value Ref Range Status   SARS Coronavirus 2 NEGATIVE NEGATIVE Final    Comment: (NOTE) SARS-CoV-2 target nucleic acids are NOT DETECTED. The SARS-CoV-2 RNA is generally detectable in upper and lower respiratory specimens during the acute phase of infection. Negative results do not preclude SARS-CoV-2 infection, do not rule out co-infections with other pathogens, and should not be used as the sole basis for treatment or other patient management decisions. Negative results must be combined with clinical observations, patient history, and epidemiological information. The expected result is Negative. Fact Sheet for Patients: HairSlick.no Fact Sheet for Healthcare Providers: quierodirigir.com This test is not yet approved or cleared by the [place] FDA and  has been authorized for detection and/or diagnosis of SARS-CoV-2  by FDA under an Emergency Use Authorization (EUA). This EUA will remain  in effect (meaning this test can be used) for the duration of the COVID-19 declaration under Section 56 4(b)(1) of the Act, 21 U.S.C. section 360bbb-3(b)(1), unless the authorization is terminated or revoked sooner. Performed at [facility], [address] [place], [place] [address]   MRSA PCR Screening     Status: None   Collection Time: 11/17/19  7:50 AM   Specimen: Nasal Mucosa; Nasopharyngeal  Result Value Ref Range Status   MRSA by PCR NEGATIVE NEGATIVE Final    Comment:        The GeneXpert MRSA Assay (FDA approved for NASAL specimens only), is one component of a comprehensive MRSA colonization surveillance program. It is not intended to diagnose MRSA infection nor to guide or monitor treatment for MRSA infections. Performed at [facility], [address], [place], [place] [address]          Radiology Studies: Ct Head Wo Contrast  Result Date: 11/16/2019 CLINICAL DATA:  Pt arrives GEM[facility] from [facility] with complaints of AMS since yesterday. Facility staff reports that the pt is more altered than normal. Pt has advanced dementia at baseline. Reports low grade fever of 99.34f. No cough or SHOB noted. EXAM: CT HEAD WITHOUT CONTRAST TECHNIQUE: Contiguous axial images were obtained from the base of the skull through the vertex without intravenous contrast. COMPARISON:  08/25/2019 FINDINGS: Brain: No evidence of acute infarction, hemorrhage, hydrocephalus, extra-axial collection or mass lesion/mass effect. Mild periventricular white matter hypoattenuation is noted consistent with chronic microvascular ischemic change. Vascular: No hyperdense vessel or unexpected calcification. Skull: Normal. Negative for fracture or focal lesion. Sinuses/Orbits: Globes and orbits are unremarkable. Visualized sinuses and mastoid air cells are clear. Other: None. IMPRESSION: 1. No acute  intracranial abnormalities. 2. Mild chronic microvascular ischemic change. Electronically Signed   By: Amie Portland M.D.   On: 11/16/2019 20:02   Dg Chest Port 1 View  Result Date: 11/16/2019 CLINICAL DATA:  Pt arrives [facility] from [facility] with complaints of AMS since yesterday. Facility staff reports that the pt is more altered than normal. Pt has advanced dementia at baseline. Reports low grade fever of 99.24f. No cough or SHOB noted. HTN, DM, dementia EXAM: PORTABLE CHEST 1 VIEW COMPARISON:  08/25/2019 FINDINGS: Cardiac silhouette mildly enlarged. No mediastinal or hilar masses. Mild opacity at the right lung base which is likely atelectasis. Lungs otherwise clear. No convincing pleural effusion and no pneumothorax. Skeletal structures are grossly intact. IMPRESSION: No acute cardiopulmonary disease. Electronically Signed   By: Amie Portland M.D.   On: 11/16/2019 19:53   Ct Renal Stone Study  Result Date: 11/16/2019 CLINICAL DATA:  Flank pain. Renal stone disease suspected. Altered mental status since yesterday. EXAM: CT ABDOMEN AND PELVIS WITHOUT CONTRAST TECHNIQUE: Multidetector CT imaging of the abdomen and pelvis was performed following the standard protocol without IV contrast. COMPARISON:  08/25/2019 FINDINGS: Lower chest: No acute abnormality. Hepatobiliary: No focal liver abnormality is seen. No gallstones, gallbladder wall thickening, or biliary dilatation. Pancreas: Unremarkable. No pancreatic ductal dilatation or surrounding inflammatory changes. Spleen: Normal in size without focal abnormality. Adrenals/Urinary Tract: No adrenal masses. Kidneys normal in overall size, orientation and position. Subcentimeter hyperattenuating cortical lesion from the upper pole the right kidney is likely a small hemorrhagic cyst but too small to characterize. No other renal masses, no stones and no hydronephrosis. Ureters normal in course and in caliber. Bladder is unremarkable. Stomach/Bowel: Stomach is  unremarkable. Small bowel and colon are normal in caliber. No wall thickening or inflammation. No evidence of appendicitis. Vascular/Lymphatic: Minimal aortic atherosclerosis. No aneurysm. No enlarged lymph nodes. Reproductive: Status post hysterectomy. No adnexal masses. Other: No abdominal wall hernia or abnormality. No abdominopelvic ascites. Musculoskeletal: No fracture or acute finding. No osteoblastic or osteolytic lesions. Degenerative changes noted throughout the visualized spine. IMPRESSION: 1. No acute findings. No renal or ureteral stones or obstructive uropathy. 2. Mild aortic atherosclerosis. Status post hysterectomy. Electronically Signed   By: Amie Portland M.D.   On: 11/16/2019 20:07   Ue Venous Duplex ([facility] And [facility] Only)  Result Date: 11/17/2019 UPPER VENOUS STUDY  Indications: Pain Comparison Study: No prior study on file for comparison Performing Technologist: Sherren Kerns RVS  Examination Guidelines: A complete evaluation includes B-mode imaging, spectral Doppler, color Doppler, and power Doppler as needed  of all accessible portions of each vessel. Bilateral testing is considered an integral part of a complete examination. Limited examinations for reoccurring indications may be performed as noted.  Right Findings: +----------+------------+---------+-----------+----------+-------+ RIGHT     CompressiblePhasicitySpontaneousPropertiesSummary +----------+------------+---------+-----------+----------+-------+ Subclavian               Yes       Yes                      +----------+------------+---------+-----------+----------+-------+  Left Findings: +----------+------------+---------+-----------+----------+---------------+ LEFT      CompressiblePhasicitySpontaneousProperties    Summary     +----------+------------+---------+-----------+----------+---------------+ IJV                      Yes       Yes                               +----------+------------+---------+-----------+----------+---------------+ Subclavian               Yes       Yes                              +----------+------------+---------+-----------+----------+---------------+ Axillary      Full       Yes       Yes                              +----------+------------+---------+-----------+----------+---------------+ Brachial      Full       Yes       Yes                              +----------+------------+---------+-----------+----------+---------------+ Radial        Full                                                  +----------+------------+---------+-----------+----------+---------------+ Ulnar                                               patent by color +----------+------------+---------+-----------+----------+---------------+ Cephalic      Full                                                  +----------+------------+---------+-----------+----------+---------------+ Basilic       Full                                                  +----------+------------+---------+-----------+----------+---------------+  Summary:  Right: No evidence of thrombosis in the subclavian.  Left: No evidence of deep vein thrombosis in the upper extremity. No evidence of superficial vein thrombosis in the upper extremity.  *See table(s) above for measurements and observations.  Diagnosing physician: Gretta Began MD Electronically signed by Gretta Began MD on 11/17/2019  at 1:37:36 PM.    Final         Scheduled Meds: . amLODipine  10 mg Oral q morning - 10a  . cholecalciferol  2,000 Units Oral Daily  . escitalopram  10 mg Oral Daily  . estradiol  1 Applicatorful Vaginal Weekly  . heparin  5,000 Units Subcutaneous Q8H  . hydrocerin  1 application Topical BID  . insulin aspart  0-5 Units Subcutaneous QHS  . insulin aspart  0-6 Units Subcutaneous TID WC  . OLANZapine zydis  10 mg Oral QPM  . pantoprazole  20 mg Oral Daily  .  polyvinyl alcohol  1 drop Both Eyes BID  . traMADol  50 mg Oral BID   Continuous Infusions: . sodium chloride 125 mL/hr at 11/17/19 1439     LOS: 1 day    Time spent: 36 minutes spent on chart review, discussion with nursing staff, consultants, updating family and interview/physical exam; more than 50% of that time was spent in counseling and/or coordination of care.    Alvira Philips [place], DO [facility] 11/17/2019, 7:47 PM

## 2019-11-17 NOTE — Evaluation (Signed)
Physical Therapy Evaluation Patient Details Name: Katrina Burton MRN: [id number] DOB: 1936-07-25 Today's Date: 11/17/2019   History of Present Illness  83 y.o. female hx of AKI, dementia, DM, HL, HTN here presenting with altered mental status, low-grade temp from [facility].  Patient was seen here 3 days ago for a fall and was noted to have left upper extremity swelling.  Patient had x-rays done that was negative and DVT study that was ordered but was never performed  Clinical Impression  Pt admitted with above diagnosis.  Pt currently with functional limitations due to the deficits listed below (see PT Problem List). Pt will benefit from skilled PT to increase their independence and safety with mobility to allow discharge to the venue listed below.  Pt able to state she is from [facility] and has assist for ADLs and "paying bills."  Pt assisted with ambulating in hallway however limited distance due to fatigue and mild dizziness.  Pt may be near mobility baseline however if 24/7 assist not available upon return to facility then pt may need SNF.  Pt reports hx of falls.    Follow Up Recommendations Supervision/Assistance - 24 hour;SNF    Equipment Recommendations  None recommended by PT    Recommendations for Other Services       Precautions / Restrictions Precautions Precautions: Fall      Mobility  Bed Mobility Overal bed mobility: Needs Assistance Bed Mobility: Supine to Sit     Supine to sit: Min guard     General bed mobility comments: verbal cues for technique, min/guard for safety  Transfers Overall transfer level: Needs assistance Equipment used: Rolling walker (2 wheeled) Transfers: Sit to/from Stand Sit to Stand: Min guard;Min assist         General transfer comment: assist with initial rise and steady however min/guard thereafter  Ambulation/Gait Ambulation/Gait assistance: Min guard Gait Distance (Feet): 80 Feet Assistive device: Rolling  walker (2 wheeled) Gait Pattern/deviations: Step-through pattern;Decreased stride length;Trunk flexed     General Gait Details: verbal cues for posture and RW positioning, pt reports mild dizziness after 40 feet so ambulated back to room; nursing changing out bed (to low bed) so unable to obtain BP at that time however pt reports dizziness resolved with sitting  Stairs            Wheelchair Mobility    Modified Rankin (Stroke Patients Only)       Balance Overall balance assessment: History of Falls                                           Pertinent Vitals/Pain Pain Assessment: No/denies pain(denies any arm pain today)    Home Living Family/patient expects to be discharged to:: Assisted living               Home Equipment: Dan Humphreys - 2 wheels Additional Comments: pt reports being from [facility]    Prior Function Level of Independence: Needs assistance   Gait / Transfers Assistance Needed: pt ambulatory with RW  ADL's / Homemaking Assistance Needed: reports assist        Hand Dominance        Extremity/Trunk Assessment        Lower Extremity Assessment Lower Extremity Assessment: Generalized weakness       Communication   Communication: No difficulties  Cognition Arousal/Alertness: Awake/alert Behavior During Therapy: [facility] for  tasks assessed/performed Overall Cognitive Status: History of cognitive impairments - at baseline                                 General Comments: pt with hx of dementia, appropriately following simple commands      General Comments      Exercises     Assessment/Plan    PT Assessment Patient needs continued PT services  PT Problem List Decreased strength;Decreased mobility;Decreased activity tolerance;Decreased balance;Decreased knowledge of use of DME;Decreased safety awareness       PT Treatment Interventions DME instruction;Therapeutic activities;Gait training;Therapeutic  exercise;Patient/family education;Functional mobility training;Balance training    PT Goals (Current goals can be found in the Care Plan section)  Acute Rehab PT Goals PT Goal Formulation: With patient Time For Goal Achievement: 12/01/19 Potential to Achieve Goals: Good    Frequency Min 2X/week   Barriers to discharge        Co-evaluation               AM-PAC PT "6 Clicks" Mobility  Outcome Measure Help needed turning from your back to your side while in a flat bed without using bedrails?: A Little Help needed moving from lying on your back to sitting on the side of a flat bed without using bedrails?: A Little Help needed moving to and from a bed to a chair (including a wheelchair)?: A Little Help needed standing up from a chair using your arms (e.g., wheelchair or bedside chair)?: A Little Help needed to walk in hospital room?: A Little Help needed climbing 3-5 steps with a railing? : A Lot 6 Click Score: 17    End of Session Equipment Utilized During Treatment: Gait belt Activity Tolerance: Patient tolerated treatment well Patient left: with call bell/phone within reach;with nursing/sitter in room(with Nurse tech on [facility]) Nurse Communication: Mobility status PT Visit Diagnosis: Difficulty in walking, not elsewhere classified (R26.2);Muscle weakness (generalized) (M62.81)    Time: 1610-9604 PT Time Calculation (min) (ACUTE ONLY): 16 min   Charges:   PT Evaluation $PT Eval Low Complexity: [place], PT, DPT Acute Rehabilitation Services Office: [phone] Pager: [phone]  Trena Platt 11/17/2019, 2:34 PM

## 2019-11-17 NOTE — TOC Initial Note (Addendum)
Transition of Care [facility]) - Initial/Assessment Note    Patient Details  Name: Katrina Burton MRN: [id number] Date of Birth: November 13, 1936  Transition of Care [facility]) CM/SW Contact:    Ida Rogue, LCSW Phone Number: 11/17/2019, 3:03 PM  Clinical Narrative:  Patient was alerted to this patient as she is the resident at [facility].  Attempted to contact daughter, but the phone rang with no opportunity to leave message.   Spoke to Tammy at [facility] who stated patient is ambulatory at baseline with walker, and that she is supervision only with transfers.  Her only concern is that if patient needs actual hands on help with transfers, they would not be able to manage her due to her weight.  She is also concerned about the dizziness, and is wondering what is causing that. That being said, she is hopeful that Ms Ringgold can return as she is a long term resident, and they are doing everything they can to minimize risk for COVID as they have had no outbreaks to this point.  She and I agreed to talk again tomorrow. She also states that they have PT available there to some extent.   She gave me a different number for the daughter-[phone].  Spoke to daughter who is appreciative of all information that has been flowing her way.   TOC will continue to follow during the course of hospitalization.  Facility will require COVID test within 48 hours of d/c and FL2.               Barriers to Discharge: No Barriers Identified   Patient Goals and CMS Choice        Expected Discharge Plan and Services   In-house Referral: Clinical Social Work Discharge Planning Services: CM Consult   Living arrangements for the past 2 months: Skilled Nursing Facility                                      Prior Living Arrangements/Services Living arrangements for the past 2 months: Skilled Nursing Facility Lives with:: Facility Resident Patient language and need for interpreter  reviewed:: Yes Do you feel safe going back to the place where you live?: Yes      Need for Family Participation in Patient Care: No (Comment) Care giver support system in place?: Yes (comment) Current home services: DME Criminal Activity/Legal Involvement Pertinent to Current Situation/Hospitalization: No - Comment as needed  Activities of Daily Living Home Assistive Devices/Equipment: None ADL Screening (condition at time of admission) Patient's cognitive ability adequate to safely complete daily activities?: No Is the patient deaf or have difficulty hearing?: No Does the patient have difficulty seeing, even when wearing glasses/contacts?: No Does the patient have difficulty concentrating, remembering, or making decisions?: Yes Patient able to express need for assistance with ADLs?: Yes Does the patient have difficulty dressing or bathing?: Yes Independently performs ADLs?: No Communication: Independent, Appropriate for developmental age Dressing (OT): Needs assistance Is this a change from baseline?: Pre-admission baseline Grooming: Needs assistance Is this a change from baseline?: Pre-admission baseline Feeding: Needs assistance Is this a change from baseline?: Pre-admission baseline Bathing: Dependent Is this a change from baseline?: Pre-admission baseline Toileting: Needs assistance, Dependent Is this a change from baseline?: Pre-admission baseline In/Out Bed: Needs assistance, Dependent Is this a change from baseline?: Pre-admission baseline Walks in Home: Needs assistance Is this a change from baseline?:  Pre-admission baseline Does the patient have difficulty walking or climbing stairs?: Yes Weakness of Legs: Both Weakness of Arms/Hands: Both  Permission Sought/Granted Permission sought to share information with : Family Supports    Share Information with NAME: Enid Derry     Permission granted to share info w Relationship: daughter  Permission granted to share info w  Contact Information: [phone]  Emotional Assessment Appearance:: Appears stated age Attitude/Demeanor/Rapport: Unable to Assess Affect (typically observed): Unable to Assess Orientation: : Oriented to Self Alcohol / Substance Use: Not Applicable Psych Involvement: No (comment)  Admission diagnosis:  AKI (acute kidney injury) ([place]) [N17.9] Patient Active Problem List   Diagnosis Date Noted  . Acute renal failure (ARF) ([place]) 11/16/2019  . Acute metabolic encephalopathy 37/16/9678  . CKD (chronic kidney disease) stage 3, GFR 30-59 ml/min 11/16/2019  . Diarrhea 08/27/2019  . Hypomagnesemia 08/27/2019  . Febrile illness 08/26/2019  . DKA (diabetic ketoacidoses) ([place]) 12/12/2013  . Hyponatremia 12/12/2013  . UTI (lower urinary tract infection) 04/29/2013  . DM (diabetes mellitus) ([place]) 04/29/2013  . Hypertension   . Dementia ([place])    PCP:  Sande Brothers, MD Pharmacy:  No Pharmacies Listed    Social Determinants of Health (SDOH) Interventions    Readmission Risk Interventions No flowsheet data found.

## 2019-11-18 DIAGNOSIS — Z7189 Other specified counseling: Secondary | ICD-10-CM

## 2019-11-18 DIAGNOSIS — Z515 Encounter for palliative care: Secondary | ICD-10-CM

## 2019-11-18 LAB — CBC
HCT: 34.4 % — ABNORMAL LOW (ref 36.0–46.0)
Hemoglobin: 10.5 g/dL — ABNORMAL LOW (ref 12.0–15.0)
MCH: 29.1 pg (ref 26.0–34.0)
MCHC: 30.5 g/dL (ref 30.0–36.0)
MCV: 95.3 fL (ref 80.0–100.0)
Platelets: 253 10*3/uL (ref 150–400)
RBC: 3.61 MIL/uL — ABNORMAL LOW (ref 3.87–5.11)
RDW: 12.7 % (ref 11.5–15.5)
WBC: 6.6 10*3/uL (ref 4.0–10.5)
nRBC: 0 % (ref 0.0–0.2)

## 2019-11-18 LAB — BASIC METABOLIC PANEL
Anion gap: 7 (ref 5–15)
BUN: 26 mg/dL — ABNORMAL HIGH (ref 8–23)
CO2: 19 mmol/L — ABNORMAL LOW (ref 22–32)
Calcium: 9.5 mg/dL (ref 8.9–10.3)
Chloride: 112 mmol/L — ABNORMAL HIGH (ref 98–111)
Creatinine, Ser: 1.36 mg/dL — ABNORMAL HIGH (ref 0.44–1.00)
GFR calc Af Amer: 42 mL/min — ABNORMAL LOW (ref >60)
GFR calc non Af Amer: 36 mL/min — ABNORMAL LOW (ref >60)
Glucose, Bld: 112 mg/dL — ABNORMAL HIGH (ref 70–99)
Potassium: 4.4 mmol/L (ref 3.5–5.1)
Sodium: 138 mmol/L (ref 135–145)

## 2019-11-18 LAB — GLUCOSE, CAPILLARY
Glucose-Capillary: 118 mg/dL — ABNORMAL HIGH (ref 70–99)
Glucose-Capillary: 115 mg/dL — ABNORMAL HIGH (ref 70–99)
Glucose-Capillary: 141 mg/dL — ABNORMAL HIGH (ref 70–99)
Glucose-Capillary: 139 mg/dL — ABNORMAL HIGH (ref 70–99)

## 2019-11-18 NOTE — Consult Note (Signed)
Consultation Note Date: 11/18/2019   Patient Name: Katrina Burton  DOB: [birth year]  MRN: [id number]  Age / Sex: 83 y.o., female  PCP: Sande Brothers, MD Referring Physician: [place] Eric J, DO  Reason for Consultation: Establishing goals of care  HPI/Patient Profile: 83 y.o. female   admitted on 11/16/2019   Clinical Assessment and Goals of Care: 83 year old lady with a history of advanced dementia, diabetes, hypertension and dyslipidemia.  Patient has been living at [facility] facility since 2012.  Patient was admitted with low-grade fever and altered mental status.  She was admitted to hospital medicine service with acute kidney injury on top of underlying stage III chronic kidney disease, acute metabolic encephalopathy with underlying dementia.  Adult failure to thrive.  Patient was found to have mild leukocytosis however chest x-ray not suggestive of pneumonia, Covid test was negative and urine culture showed no growth.  A palliative consult has been requested for goals of care discussions.  Patient is awake alert sitting up in bed.  She interacts freely and appropriately with her daughter Enid Derry present at the bedside.  Patient does not appear to be in distress.  Patient and daughter are trying to order supper for the patient.  Patient wants to eat pork chops and pinto beans.  Palliative medicine is specialized medical care for people living with serious illness. It focuses on providing relief from the symptoms and stress of a serious illness. The goal is to improve quality of life for both the patient and the family.  Goals of care: Broad aims of medical therapy in relation to the patient's values and preferences. Our aim is to provide medical care aimed at enabling patients to achieve the goals that matter most to them, given the circumstances of their particular medical situation and their  constraints.   Attempted to discuss goals of care with the patient's daughter Enid Derry.  She states that she understands the active issues going on in this hospitalization.  However, she believes that if [facility] memory care unit nursing staff encourages the patient to take p.o., patient will not get dehydrated again.  She wishes to continue current mode of care for now.  See additional recommendations as listed below.  Thank you for the consult.  NEXT OF KIN Daughter Enid Derry  SUMMARY OF RECOMMENDATIONS   Full code for now Discussed with daughter present at the bedside.  She wishes for the patient to go back to [facility] facility upon discharge.  She hopes that efforts will be made over there to encourage the patient to drink enough fluids and take enough food by mouth.  At this point in time, goals are not palliative focused.  Code Status/Advance Care Planning:  Full code    Symptom Management:   As above  Palliative Prophylaxis:   Delirium Protocol  Additional Recommendations (Limitations, Scope, Preferences):  Full Scope Treatment  Psycho-social/Spiritual:   Desire for further Chaplaincy support:yes  Additional Recommendations: Caregiving  Support/Resources  Prognosis:   Unable to determine  Discharge Planning:  Primary Diagnoses: Present on Admission: . Acute renal failure (ARF) (HCC) . Hypertension   I have reviewed the medical record, interviewed the patient and family, and examined the patient. The following aspects are pertinent.  Past Medical History:  Diagnosis Date  . Acute kidney injury (HCC)   . Dementia (HCC)   . Diabetes mellitus   . Hyperlipidemia   . Hypertension    Social History   Socioeconomic History  . Marital status: Single    Spouse name: Not on file  . Number of children: Not on file  . Years of education: Not on file  . Highest education level: Not on file  Occupational History  . Not on file  Social  Needs  . Financial resource strain: Not on file  . Food insecurity    Worry: Not on file    Inability: Not on file  . Transportation needs    Medical: Not on file    Non-medical: Not on file  Tobacco Use  . Smoking status: Never Smoker  . Smokeless tobacco: Never Used  Substance and Sexual Activity  . Alcohol use: No  . Drug use: No  . Sexual activity: Never  Lifestyle  . Physical activity    Days per week: Not on file    Minutes per session: Not on file  . Stress: Not on file  Relationships  . Social Musicianconnections    Talks on phone: Not on file    Gets together: Not on file    Attends religious service: Not on file    Active member of club or organization: Not on file    Attends meetings of clubs or organizations: Not on file    Relationship status: Not on file  Other Topics Concern  . Not on file  Social History Narrative  . Not on file   History reviewed. No pertinent family history. Scheduled Meds: . amLODipine  10 mg Oral q morning - 10a  . cholecalciferol  2,000 Units Oral Daily  . escitalopram  10 mg Oral Daily  . estradiol  1 Applicatorful Vaginal Weekly  . heparin  5,000 Units Subcutaneous Q8H  . hydrocerin  1 application Topical BID  . insulin aspart  0-5 Units Subcutaneous QHS  . insulin aspart  0-6 Units Subcutaneous TID WC  . OLANZapine zydis  10 mg Oral QPM  . pantoprazole  20 mg Oral Daily  . polyvinyl alcohol  1 drop Both Eyes BID  . traMADol  50 mg Oral BID   Continuous Infusions: . sodium chloride 125 mL/hr at 11/17/19 2319   PRN Meds:.acetaminophen **OR** acetaminophen, loratadine, neomycin-bacitracin-polymyxin, nystatin, olopatadine Medications Prior to Admission:  Prior to Admission medications   Medication Sig Start Date End Date Taking? Authorizing Provider  acetaminophen (TYLENOL) 500 MG tablet Take 500 mg by mouth every 4 (four) hours as needed for fever or headache.    Yes [provider]  alum & mag hydroxide-simeth  (MAALOX/MYLANTA) 200-200-20 MG/5ML suspension Take 30 mLs by mouth every 6 (six) hours as needed for indigestion or heartburn.   Yes [provider]  amLODipine (NORVASC) 10 MG tablet Take 10 mg by mouth every morning.    Yes [provider]  carboxymethylcellul-glycerin (OPTIVE) 0.5-0.9 % ophthalmic solution Place 1 drop into both eyes 2 (two) times daily.   Yes [provider]  cetirizine (ZYRTEC) 10 MG tablet Take 10 mg by mouth daily as needed for allergies.   Yes [provider]  Cholecalciferol (VITAMIN D3) 2000  UNITS TABS Take 2,000 Units by mouth daily.   Yes [provider]  escitalopram (LEXAPRO) 10 MG tablet Take 10 mg by mouth daily.   Yes [provider]  guaifenesin (ROBITUSSIN) 100 MG/5ML syrup Take 200 mg by mouth every 6 (six) hours as needed for cough.   Yes [provider]  loperamide (IMODIUM) 2 MG capsule Take 2 mg by mouth as needed for diarrhea or loose stools. For diarrhea   Yes [provider]  LORazepam (ATIVAN) 0.5 MG tablet Take 2 tablets (1 mg total) by mouth at bedtime. 08/29/19  Yes Standley Brooking, MD  magnesium hydroxide (MILK OF MAGNESIA) 400 MG/5ML suspension Take 30 mLs by mouth at bedtime as needed for mild constipation.   Yes [provider]  metFORMIN (GLUCOPHAGE) 1000 MG tablet Take 1,000 mg by mouth 2 (two) times daily with a meal.   Yes [provider]  Neomycin-Bacitracin-Polymyxin (TRIPLE ANTIBIOTIC) [phone] OINT Apply 1 application topically as needed (Minor skin tears or abrasions).   Yes [provider]  nystatin (NYSTATIN) powder Apply 1 Bottle topically daily as needed (Apply to Groin).    Yes [provider]  OLANZapine zydis (ZYPREXA) 10 MG disintegrating tablet Take 10 mg by mouth every evening.    Yes [provider]  olopatadine (PATANOL) 0.1 % ophthalmic solution Place 1 drop into both eyes daily as needed for allergies.   Yes  [provider]  pantoprazole (PROTONIX) 20 MG tablet Take 20 mg by mouth daily.    Yes [provider]  Skin Protectants, Misc. (BAZA PROTECT EX) Apply 1 application topically See admin instructions. Every shift   Yes [provider]  Skin Protectants, Misc. (MINERIN CREME) CREA Apply 1 application topically 2 (two) times daily. 06AM and 1800 Apply to total body   Yes [provider]  spironolactone (ALDACTONE) 25 MG tablet Take 25 mg by mouth daily.   Yes [provider]  traMADol (ULTRAM) 50 MG tablet Take 1 tablet (50 mg total) by mouth 2 (two) times daily. Foot pain. 08/29/19  Yes Standley Brooking, MD  estradiol (ESTRACE) 0.1 MG/GM vaginal cream Place 1 g vaginally once a week. 10/06/19   [provider]  insulin aspart (NOVOLOG) 100 UNIT/ML injection Inject 6 Units into the skin as needed for high blood sugar. Sliding scale BS 0.350 = 0 unitrs If blood sugar is greater than 350 give 6 units FSBS every Monday, Wednesday and Friday    [provider]   Allergies  Allergen Reactions  . Bumetanide Other (See Comments)    unknown  . Lasix [Furosemide] Hives  . Lipitor [Atorvastatin] Hives  . Other Other (See Comments)    Etacrynic acid unknown  . Soap Other (See Comments)    AGENT:Certain types of soap  . Statins Other (See Comments)    Hmg -Coa Reductase inhibitors  . Torsemide Other (See Comments)    Unknown   Review of Systems Patient has somewhat slurred speech but denies pain  Physical Exam Awake alert sitting up in bed Has slurred speech but is able to follow commands and respond appropriately S1-S2 Lungs clear No edema Has baseline dementia Abdomen is soft nontender  Vital Signs: BP 136/79 (BP Location: Right Arm)   Pulse 84   Temp 98 F (36.7 C) (Oral)   Resp 20   Wt 85.9 kg   SpO2 100%   BMI 32.51 kg/m  Pain Scale: 0-10   Pain Score: 4  SpO2: SpO2: 100 % O2 Device:SpO2: 100 % O2 Flow  Rate: .   IO: Intake/output summary:   Intake/Output Summary (Last 24 hours) at 11/18/2019 1527 Last data filed at 11/18/2019 1335 Gross per 24 hour  Intake 712 ml  Output 1800 ml  Net -1088 ml    LBM: Last BM Date: (unsure, patient has dementia) Baseline Weight: Weight: 85.9 kg Most recent weight: Weight: 85.9 kg     Palliative Assessment/Data:     Time In:  1400 Time Out:  1500 Time Total:   60  Greater than 50%  of this time was spent counseling and coordinating care related to the above assessment and plan.  Signed by: Rosalin Hawking, MD   Please contact Palliative Medicine Team phone at [phone] for questions and concerns.  For individual provider: See Loretha Stapler

## 2019-11-18 NOTE — Progress Notes (Signed)
PROGRESS NOTE    Katrina Burton  KGM:[id number] DOB: 1936-11-13 DOA: 11/16/2019 PCP: Ron Parker, MD    Brief Narrative:  Katrina Burton is a 83 y.o. female with medical history significant of advanced dementia, type 2 diabetes, hypertension, hyperlipidemia presenting to the hospital via EMS from her nursing home for evaluation of low-grade fever (99.9 F) and altered mental status.  Noted to be less responsive today.  No cough or shortness of breath reported.  Patient was seen in the ED 3 days ago for a fall and was noted to have left upper extremity swelling.  She had x-rays done which were negative and a DVT study was ordered but never performed.   No significant history could be obtained from the patient due to her advanced dementia.  ED Course: Afebrile.  WBC count mildly elevated at 10.6.  Lactic acid normal.  UA not suggestive of infection.  Urine culture pending.  Blood culture x2 pending.  Rapid SARS-CoV-2 test negative.  Blood glucose 152.  BUN 63, creatinine 3.9.  Baseline creatinine 1.2-1.4.  Potassium 5.3, bicarb 21. High-sensitivity troponin negative. Chest x-ray showing no active cardiopulmonary disease. Head CT negative for acute intracranial abnormalities. CT renal stone study showing no renal or ureteral stones or obstructive uropathy. Left upper extremity Doppler done in the ED negative for DVT (preliminary report was given to Dr. Silverio Lay). Patient received Tylenol and 1 L normal saline bolus.   Assessment & Plan:   Principal Problem:   Acute renal failure (ARF) (HCC) Active Problems:   Hypertension   DM (diabetes mellitus) (HCC)   Acute metabolic encephalopathy   CKD (chronic kidney disease) stage 3, GFR 30-59 ml/min   AKI on CKD stage III BUN 63, creatinine 3.9.  Baseline creatinine 1.2-1.4.  Potassium 5.3, bicarb 21.  AKI likely prerenal from dehydration and home spironolactone use.  CT showing no renal or ureteral stones or obstructive uropathy. --Cr  3.96-->2.32-->1.36 --Urine sodium 91 and creatinine 298 --Continue IV fluid hydration --Closely monitor urine output --Avoid nephrotoxins, renally dose all medications --monitor renal function daily  Acute metabolic encephalopathy Hx Dementia Suspect due to uremia in the setting of acute renal failure.  Likely also complicated by history of dementia. No infectious etiology identified.  Head CT negative for acute intracranial abnormality.  Patient is currently awake and alert and resting comfortably.  Ammonia level 30, within normal limits.  TSH 1.645 and vitamin B12 within normal limits. --Management of AKI as above   Adult failure to thrive Back patient with poor oral intake.  Likely progressing dementia.  Discussed with daughter at bedside today, may consider hospice versus palliative care following on discharge.  Mild leukocytosis Afebrile.  WBC count mildly elevated at 10.6.  Lactic acid normal.  No signs of sepsis.  UA not suggestive of infection.  Chest x-ray not suggestive of pneumonia.  SARS-CoV-2 test negative --Urine culture no growth --Continue to monitor WBC count  Insulin-dependent diabetes mellitus Hemoglobin A1c 6.4, well controlled.   --Sliding scale insulin and CBG checks.  Depression/mood disorder --Continue Lexapro, Zyprexa  Hypertension --Continue amlodipine  Chronic pain --Continue tramadol --Tylenol as needed   DVT prophylaxis: Heparin Code Status: Full code Family Communication: Updated patient's daughter, Katrina Burton at bedside this afternoon Disposition Plan: Continue inpatient, from [facility] memory care unit; likely discharge back to memory care unit tomorrow   Consultants:   Palliative care  Procedures:   Vascular duplex ultrasound bilateral upper extremities: Summary:   Right: No evidence of thrombosis in the subclavian.  Left: No evidence of deep vein thrombosis in the upper extremity. No evidence of superficial vein  thrombosis in the upper extremity.  Antimicrobials:   none   Subjective: Patient seen and examined at bedside, resting comfortably, pleasantly confused.  No complaints this morning.  Patient's daughter, Katrina Burton present at bedside. Patient denies headache, no chest pain, no shortness of breath, no nausea cefonicid/diarrhea, no fever/chills/night sweats, no palpitations, no abdominal pain, no paresthesias.  No acute events overnight per nursing staff.  Objective: Vitals:   11/17/19 1310 11/17/19 2128 11/18/19 0536 11/18/19 1334  BP: (!) 147/71 140/72 (!) 154/74 136/79  Pulse: 62 60 76 84  Resp: Temp: 97.7 F (36.5 C) 98.2 F (36.8 C) 97.9 F (36.6 C) 98 F (36.7 C)  TempSrc: Oral   Oral  SpO2: 96% 97% 100% 100%  Weight:        Intake/Output Summary (Last 24 hours) at 11/18/2019 1420 Last data filed at 11/18/2019 1335 Gross per 24 hour  Intake 1197.33 ml  Output 1800 ml  Net -602.67 ml   Filed Weights   11/16/19 2222  Weight: 85.9 kg    Examination:  General exam: Appears calm and comfortable, no acute distress, pleasantly confused Respiratory system: Clear to auscultation. Respiratory effort normal. Cardiovascular system: S1 & S2 heard, RRR. No JVD, murmurs, rubs, gallops or clicks. No pedal edema. Gastrointestinal system: Abdomen is nondistended, soft and nontender. No organomegaly or masses felt. Normal bowel sounds heard. Central nervous system: Alert, oriented that she is in the hospital, year is 2012, president is Camera operator. No focal neurological deficits. Extremities: Symmetric 5 x 5 power. Skin: No rashes, lesions or ulcers Psychiatry: Judgement and insight appear poor. Mood & affect appropriate.     Data Reviewed: I have personally reviewed following labs and imaging studies  CBC: Recent Labs  Lab 11/16/19 1847 11/17/19 0535 11/18/19 0621  WBC 10.6* 10.0 6.6  NEUTROABS 6.7  --   --   HGB 10.8* 11.1* 10.5*  HCT 35.1* 35.4* 34.4*  MCV 96.2  96.5 95.3  PLT 279 271 253   Basic Metabolic Panel: Recent Labs  Lab 11/16/19 1847 11/17/19 0535 11/18/19 0621  NA 136 137 138  K 5.3* 4.9 4.4  CL 106 109 112*  CO2 21* 19* 19*  GLUCOSE 152* 111* 112*  BUN 63* 44* 26*  CREATININE 3.96* 2.32* 1.36*  CALCIUM 10.1 9.2 9.5   GFR: Estimated Creatinine Clearance: 34.4 mL/min (A) (by C-G formula based on SCr of 1.36 mg/dL (H)). Liver Function Tests: Recent Labs  Lab 11/16/19 1847  AST 33  ALT 19  ALKPHOS 73  BILITOT 0.5  PROT 8.3*  ALBUMIN 4.0   No results for input(s): LIPASE, AMYLASE in the last 168 hours. Recent Labs  Lab 11/16/19 2320  AMMONIA 10   Coagulation Profile: No results for input(s): INR, PROTIME in the last 168 hours. Cardiac Enzymes: No results for input(s): CKTOTAL, CKMB, CKMBINDEX, TROPONINI in the last 168 hours. BNP (last 3 results) No results for input(s): PROBNP in the last 8760 hours. HbA1C: Recent Labs    11/16/19 2320  HGBA1C 6.4*   CBG: Recent Labs  Lab 11/17/19 1139 11/17/19 1624 11/17/19 2125 11/18/19 0744 11/18/19 1142  GLUCAP 117* 91 130* 118* 115*   Lipid Profile: No results for input(s): CHOL, HDL, LDLCALC, TRIG, CHOLHDL, LDLDIRECT in the last 72 hours. Thyroid Function Tests: Recent Labs    11/16/19 2320  TSH 1.645   Anemia Panel: Recent Labs  11/16/19 2320  VITAMINB12 285   Sepsis Labs: Recent Labs  Lab 11/16/19 1849 11/16/19 2004  LATICACIDVEN 1.5 1.3    Recent Results (from the past 240 hour(s))  Blood culture (routine x 2)     Status: None (Preliminary result)   Collection Time: 11/16/19  6:04 PM   Specimen: BLOOD  Result Value Ref Range Status   Specimen Description   Final    BLOOD LEFT WRIST Performed at [facility] [address], [place], [place] [address]    Special Requests   Final    BOTTLES DRAWN AEROBIC AND ANAEROBIC Blood Culture adequate volume Performed at [facility] [address]  [place], [place] [address]    Culture   Final    NO GROWTH 2 DAYS Performed at [facility] [address] [place], [place] [address]    Report Status PENDING  Incomplete  Blood culture (routine x 2)     Status: None (Preliminary result)   Collection Time: 11/16/19  6:09 PM   Specimen: BLOOD  Result Value Ref Range Status   Specimen Description   Final    BLOOD RIGHT WRIST Performed at [facility] [address] [place], [place] [address]    Special Requests   Final    BOTTLES DRAWN AEROBIC ONLY Blood Culture results may not be optimal due to an inadequate volume of blood received in culture bottles Performed at [facility] [address] [place], [place] [address]    Culture   Final    NO GROWTH 2 DAYS Performed at [facility] [address], [place], [place] [address]    Report Status PENDING  Incomplete  Urine culture     Status: None   Collection Time: 11/16/19  6:57 PM   Specimen: Urine, Catheterized  Result Value Ref Range Status   Specimen Description   Final    URINE, CATHETERIZED Performed at [facility] [address], [place], [place] [address]    Special Requests   Final    NONE Performed at [facility], [place] [address] [place], [place] [address]    Culture   Final    NO GROWTH Performed at [facility], [place] [address], [place],  [address]    Report Status 11/17/2019 FINAL  Final  SARS CORONAVIRUS 2 (TAT 6-24 HRS) Nasopharyngeal Nasopharyngeal Swab     Status: None   Collection Time: 11/16/19  8:13 PM   Specimen: Nasopharyngeal Swab  Result Value Ref Range Status   SARS Coronavirus 2 NEGATIVE NEGATIVE Final    Comment: (NOTE) SARS-CoV-2 target nucleic acids are NOT DETECTED. The SARS-CoV-2 RNA is generally detectable in upper and lower respiratory specimens during the acute phase of infection. Negative results do not preclude SARS-CoV-2 infection,  do not rule out co-infections with other pathogens, and should not be used as the sole basis for treatment or other patient management decisions. Negative results must be combined with clinical observations, patient history, and epidemiological information. The expected result is Negative. Fact Sheet for Patients: SugarRoll.be Fact Sheet for Healthcare Providers: https://www.woods-mathews.com/ This test is not yet approved or cleared by the [place] FDA and  has been authorized for detection and/or diagnosis of SARS-CoV-2 by FDA under an Emergency Use Authorization (EUA). This EUA will remain  in effect (meaning this test can be used) for the duration of the COVID-19 declaration under Section 56 4(b)(1) of  the Act, 21 U.S.C. section 360bbb-3(b)(1), unless the authorization is terminated or revoked sooner. Performed at [facility], [address] [place], [place] [address]   MRSA PCR Screening     Status: None   Collection Time: 11/17/19  7:50 AM   Specimen: Nasal Mucosa; Nasopharyngeal  Result Value Ref Range Status   MRSA by PCR NEGATIVE NEGATIVE Final    Comment:        The GeneXpert MRSA Assay (FDA approved for NASAL specimens only), is one component of a comprehensive MRSA colonization surveillance program. It is not intended to diagnose MRSA infection nor to guide or monitor treatment for MRSA infections. Performed at [facility], [address] [place], [place] [address]          Radiology Studies: Ct Head Wo Contrast  Result Date: 11/16/2019 CLINICAL DATA:  [facility] with complaints of AMS since yesterday. Facility staff reports that the pt is more altered than normal. Pt has advanced dementia at baseline. Reports low grade fever of 99.8632f. No cough or SHOB noted. EXAM: CT HEAD WITHOUT CONTRAST TECHNIQUE: Contiguous axial images were obtained from the base of the  skull through the vertex without intravenous contrast. COMPARISON:  08/25/2019 FINDINGS: Brain: No evidence of acute infarction, hemorrhage, hydrocephalus, extra-axial collection or mass lesion/mass effect. Mild periventricular white matter hypoattenuation is noted consistent with chronic microvascular ischemic change. Vascular: No hyperdense vessel or unexpected calcification. Skull: Normal. Negative for fracture or focal lesion. Sinuses/Orbits: Globes and orbits are unremarkable. Visualized sinuses and mastoid air cells are clear. Other: None. IMPRESSION: 1. No acute intracranial abnormalities. 2. Mild chronic microvascular ischemic change. Electronically Signed   By: Amie Portlandavid  Ormond M.D.   On: 11/16/2019 20:02   Dg Chest Port 1 View  Result Date: 11/16/2019 CLINICAL DATA:  [facility] of [facility] since yesterday. Facility staff reports that the pt is more altered than normal. Pt has advanced dementia at baseline. Reports low grade fever of 99.8332f. No cough or SHOB noted. HTN, DM, dementia EXAM: PORTABLE CHEST 1 VIEW COMPARISON:  08/25/2019 FINDINGS: Cardiac silhouette mildly enlarged. No mediastinal or hilar masses. Mild opacity at the right lung base which is likely atelectasis. Lungs otherwise clear. No convincing pleural effusion and no pneumothorax. Skeletal structures are grossly intact. IMPRESSION: No acute cardiopulmonary disease. Electronically Signed   By: Amie Portlandavid  Ormond M.D.   On: 11/16/2019 19:53   Ct Renal Stone Study  Result Date: 11/16/2019 CLINICAL DATA:  Flank pain. Renal stone disease suspected. Altered mental status since yesterday. EXAM: CT ABDOMEN AND PELVIS WITHOUT CONTRAST TECHNIQUE: Multidetector CT imaging of the abdomen and pelvis was performed following the standard protocol without IV contrast. COMPARISON:  08/25/2019 FINDINGS: Lower chest: No acute abnormality. Hepatobiliary: No focal liver abnormality is seen. No gallstones, gallbladder wall  thickening, or biliary dilatation. Pancreas: Unremarkable. No pancreatic ductal dilatation or surrounding inflammatory changes. Spleen: Normal in size without focal abnormality. Adrenals/Urinary Tract: No adrenal masses. Kidneys normal in overall size, orientation and position. Subcentimeter hyperattenuating cortical lesion from the upper pole the right kidney is likely a small hemorrhagic cyst but too small to characterize. No other renal masses, no stones and no hydronephrosis. Ureters normal in course and in caliber. Bladder is unremarkable. Stomach/Bowel: Stomach is unremarkable. Small bowel and colon are normal in caliber. No wall thickening or inflammation. No evidence of appendicitis. Vascular/Lymphatic: Minimal aortic atherosclerosis. No aneurysm. No enlarged lymph nodes. Reproductive: Status post  hysterectomy. No adnexal masses. Other: No abdominal wall hernia or abnormality. No abdominopelvic ascites. Musculoskeletal: No fracture or acute finding. No osteoblastic or osteolytic lesions. Degenerative changes noted throughout the visualized spine. IMPRESSION: 1. No acute findings. No renal or ureteral stones or obstructive uropathy. 2. Mild aortic atherosclerosis. Status post hysterectomy. Electronically Signed   By: Amie Portland M.D.   On: 11/16/2019 20:07   Ue Venous Duplex ([facility] And [facility] Only)  Result Date: 11/17/2019 UPPER VENOUS STUDY  Indications: Pain Comparison Study: No prior study on file for comparison Performing Technologist: Sherren Kerns RVS  Examination Guidelines: A complete evaluation includes B-mode imaging, spectral Doppler, color Doppler, and power Doppler as needed of all accessible portions of each vessel. Bilateral testing is considered an integral part of a complete examination. Limited examinations for reoccurring indications may be performed as noted.  Right Findings: +----------+------------+---------+-----------+----------+-------+ RIGHT      CompressiblePhasicitySpontaneousPropertiesSummary +----------+------------+---------+-----------+----------+-------+ Subclavian               Yes       Yes                      +----------+------------+---------+-----------+----------+-------+  Left Findings: +----------+------------+---------+-----------+----------+---------------+ LEFT      CompressiblePhasicitySpontaneousProperties    Summary     +----------+------------+---------+-----------+----------+---------------+ IJV                      Yes       Yes                              +----------+------------+---------+-----------+----------+---------------+ Subclavian               Yes       Yes                              +----------+------------+---------+-----------+----------+---------------+ Axillary      Full       Yes       Yes                              +----------+------------+---------+-----------+----------+---------------+ Brachial      Full       Yes       Yes                              +----------+------------+---------+-----------+----------+---------------+ Radial        Full                                                  +----------+------------+---------+-----------+----------+---------------+ Ulnar                                               patent by color +----------+------------+---------+-----------+----------+---------------+ Cephalic      Full                                                  +----------+------------+---------+-----------+----------+---------------+  Basilic       Full                                                  +----------+------------+---------+-----------+----------+---------------+  Summary:  Right: No evidence of thrombosis in the subclavian.  Left: No evidence of deep vein thrombosis in the upper extremity. No evidence of superficial vein thrombosis in the upper extremity.  *See table(s) above for measurements and  observations.  Diagnosing physician: Gretta Began MD Electronically signed by Gretta Began MD on 11/17/2019 at 1:37:36 PM.    Final         Scheduled Meds: . amLODipine  10 mg Oral q morning - 10a  . cholecalciferol  2,000 Units Oral Daily  . escitalopram  10 mg Oral Daily  . estradiol  1 Applicatorful Vaginal Weekly  . heparin  5,000 Units Subcutaneous Q8H  . hydrocerin  1 application Topical BID  . insulin aspart  0-5 Units Subcutaneous QHS  . insulin aspart  0-6 Units Subcutaneous TID WC  . OLANZapine zydis  10 mg Oral QPM  . pantoprazole  20 mg Oral Daily  . polyvinyl alcohol  1 drop Both Eyes BID  . traMADol  50 mg Oral BID   Continuous Infusions: . sodium chloride 125 mL/hr at 11/17/19 2319     LOS: 2 days    Time spent: 32 minutes spent on chart review, discussion with nursing staff, consultants, updating family and interview/physical exam; more than 50% of that time was spent in counseling and/or coordination of care.    Alvira Philips [place], DO [facility] 11/18/2019, 2:20 PM

## 2019-11-19 DIAGNOSIS — G309 Alzheimer's disease, unspecified: Secondary | ICD-10-CM

## 2019-11-19 DIAGNOSIS — F0280 Dementia in other diseases classified elsewhere without behavioral disturbance: Secondary | ICD-10-CM

## 2019-11-19 LAB — BASIC METABOLIC PANEL
Anion gap: 7 (ref 5–15)
BUN: 12 mg/dL (ref 8–23)
CO2: 19 mmol/L — ABNORMAL LOW (ref 22–32)
Calcium: 9.6 mg/dL (ref 8.9–10.3)
Chloride: 113 mmol/L — ABNORMAL HIGH (ref 98–111)
Creatinine, Ser: 1.06 mg/dL — ABNORMAL HIGH (ref 0.44–1.00)
GFR calc Af Amer: 57 mL/min — ABNORMAL LOW (ref >60)
GFR calc non Af Amer: 49 mL/min — ABNORMAL LOW (ref >60)
Glucose, Bld: 115 mg/dL — ABNORMAL HIGH (ref 70–99)
Potassium: 4.4 mmol/L (ref 3.5–5.1)
Sodium: 139 mmol/L (ref 135–145)

## 2019-11-19 LAB — SARS CORONAVIRUS 2 (TAT 6-24 HRS): SARS Coronavirus 2: NEGATIVE

## 2019-11-19 LAB — GLUCOSE, CAPILLARY
Glucose-Capillary: 108 mg/dL — ABNORMAL HIGH (ref 70–99)
Glucose-Capillary: 169 mg/dL — ABNORMAL HIGH (ref 70–99)

## 2019-11-19 MED ORDER — TRAMADOL HCL 50 MG PO TABS: 50.0000 mg | ORAL_TABLET | Freq: Two times a day (BID) | ORAL | 0 refills | Status: AC

## 2019-11-19 NOTE — Discharge Instructions (Signed)
Acute Kidney Injury, Adult ° °Acute kidney injury is a sudden worsening of kidney function. The kidneys are organs that have several jobs. They filter the blood to remove waste products and extra fluid. They also maintain a healthy balance of minerals and hormones in the body, which helps control blood pressure and keep bones strong. With this condition, your kidneys do not do their jobs as well as they should. °This condition ranges from mild to severe. Over time it may develop into long-lasting (chronic) kidney disease. Early detection and treatment may prevent acute kidney injury from developing into a chronic condition. °What are the causes? °Common causes of this condition include: °· A problem with blood flow to the kidneys. This may be caused by: °? Low blood pressure (hypotension) or shock. °? Blood loss. °? Heart and blood vessel (cardiovascular) disease. °? Severe burns. °? Liver disease. °· Direct damage to the kidneys. This may be caused by: °? Certain medicines. °? A kidney infection. °? Poisoning. °? Being around or in contact with toxic substances. °? A surgical wound. °? A hard, direct hit to the kidney area. °· A sudden blockage of urine flow. This may be caused by: °? Cancer. °? Kidney stones. °? An enlarged prostate in males. °What are the signs or symptoms? °Symptoms of this condition may not be obvious until the condition becomes severe. Symptoms of this condition can include: °· Tiredness (lethargy), or difficulty staying awake. °· Nausea or vomiting. °· Swelling (edema) of the face, legs, ankles, or feet. °· Problems with urination, such as: °? Abdominal pain, or pain along the side of your stomach (flank). °? Decreased urine production. °? Decrease in the force of urine flow. °· Muscle twitches and cramps, especially in the legs. °· Confusion or trouble concentrating. °· Loss of appetite. °· Fever. °How is this diagnosed? °This condition may be diagnosed with tests, including: °· Blood  tests. °· Urine tests. °· Imaging tests. °· A test in which a sample of tissue is removed from the kidneys to be examined under a microscope (kidney biopsy). °How is this treated? °Treatment for this condition depends on the cause and how severe the condition is. In mild cases, treatment may not be needed. The kidneys may heal on their own. In more severe cases, treatment will involve: °· Treating the cause of the kidney injury. This may involve changing any medicines you are taking or adjusting your dosage. °· Fluids. You may need specialized IV fluids to balance your body's needs. °· Having a catheter placed to drain urine and prevent blockages. °· Preventing problems from occurring. This may mean avoiding certain medicines or procedures that can cause further injury to the kidneys. °In some cases treatment may also require: °· A procedure to remove toxic wastes from the body (dialysis or continuous renal replacement therapy - CRRT). °· Surgery. This may be done to repair a torn kidney, or to remove the blockage from the urinary system. °Follow these instructions at home: °Medicines °· Take over-the-counter and prescription medicines only as told by your health care provider. °· Do not take any new medicines without your health care provider's approval. Many medicines can worsen your kidney damage. °· Do not take any vitamin and mineral supplements without your health care provider's approval. Many nutritional supplements can worsen your kidney damage. °Lifestyle °· If your health care provider prescribed changes to your diet, follow them. You may need to decrease the amount of protein you eat. °· Achieve and maintain a healthy   weight. If you need help with this, ask your health care provider.  Start or continue an exercise plan. Try to exercise at least 30 minutes a day, 5 days a week.  Do not use any tobacco products, such as cigarettes, chewing tobacco, and e-cigarettes. If you need help quitting, ask your  health care provider. General instructions  Keep track of your blood pressure. Report changes in your blood pressure as told by your health care provider.  Stay up to date with immunizations. Ask your health care provider which immunizations you need.  Keep all follow-up visits as told by your health care provider. This is important. Where to find more information  American Association of Kidney Patients: BombTimer.gl  National Kidney Foundation: www.kidney.Gem: https://mathis.com/  [facility]: ? www.lifeoptions.org ? www.kidneyschool.org Contact a health care provider if:  Your symptoms get worse.  You develop new symptoms. Get help right away if:  You develop symptoms of worsening kidney disease, which include: ? Headaches. ? Abnormally dark or light skin. ? Easy bruising. ? Frequent hiccups. ? Chest pain. ? Shortness of breath. ? End of menstruation in women. ? Seizures. ? Confusion or altered mental status. ? Abdominal or back pain. ? Itchiness.  You have a fever.  Your body is producing less urine.  You have pain or bleeding when you urinate. Summary  Acute kidney injury is a sudden worsening of kidney function.  Acute kidney injury can be caused by problems with blood flow to the kidneys, direct damage to the kidneys, and sudden blockage of urine flow.  Symptoms of this condition may not be obvious until it becomes severe. Symptoms may include edema, lethargy, confusion, nausea or vomiting, and problems passing urine.  This condition can usually be diagnosed with blood tests, urine tests, and imaging tests. Sometimes a kidney biopsy is done to diagnose this condition.  Treatment for this condition often involves treating the underlying cause. It is treated with fluids, medicines, dialysis, diet changes, or surgery. This information is not intended to replace advice given to you by your health care provider. Make  sure you discuss any questions you have with your health care provider. Document Released: 06/26/2011 Document Revised: 11/23/2017 Document Reviewed: 12/01/2016 Elsevier Patient Education  [place] Dehydration, Adult  Dehydration is when there is not enough fluid or water in your body. This happens when you lose more fluids than you take in. Dehydration can range from mild to very bad. It should be treated right away to keep it from getting very bad. Symptoms of mild dehydration may include:  Thirst.  Dry lips.  Slightly dry mouth.  Dry, warm skin.  Dizziness. Symptoms of moderate dehydration may include:  Very dry mouth.  Muscle cramps.  Dark pee (urine). Pee may be the color of tea.  Your body making less pee.  Your eyes making fewer tears.  Heartbeat that is uneven or faster than normal (palpitations).  Headache.  Light-headedness, especially when you stand up from sitting.  Fainting (syncope). Symptoms of very bad dehydration may include:  Changes in skin, such as: ? Cold and clammy skin. ? Blotchy (mottled) or pale skin. ? Skin that does not quickly return to normal after being lightly pinched and let go (poor skin turgor).  Changes in body fluids, such as: ? Feeling very thirsty. ? Your eyes making fewer tears. ? Not sweating when body temperature is high, such as in hot weather. ? Your body making very little  pee.  Changes in vital signs, such as: ? Weak pulse. ? Pulse that is more than 100 beats a minute when you are sitting still. ? Fast breathing. ? Low blood pressure.  Other changes, such as: ? Sunken eyes. ? Cold hands and feet. ? Confusion. ? Lack of energy (lethargy). ? Trouble waking up from sleep. ? Short-term weight loss. ? Unconsciousness. Follow these instructions at home:   If told by your doctor, drink an ORS: ? Make an ORS by using instructions on the package. ? Start by drinking small amounts, about  cup (120 mL)  every 5-10 minutes. ? Slowly drink more until you have had the amount that your doctor said to have.  Drink enough clear fluid to keep your pee clear or pale yellow. If you were told to drink an ORS, finish the ORS first, then start slowly drinking clear fluids. Drink fluids such as: ? Water. Do not drink only water by itself. Doing that can make the salt (sodium) level in your body get too low (hyponatremia). ? Ice chips. ? Fruit juice that you have added water to (diluted). ? Low-calorie sports drinks.  Avoid: ? Alcohol. ? Drinks that have a lot of sugar. These include high-calorie sports drinks, fruit juice that does not have water added, and soda. ? Caffeine. ? Foods that are greasy or have a lot of fat or sugar.  Take over-the-counter and prescription medicines only as told by your doctor.  Do not take salt tablets. Doing that can make the salt level in your body get too high (hypernatremia).  Eat foods that have minerals (electrolytes). Examples include bananas, oranges, potatoes, tomatoes, and spinach.  Keep all follow-up visits as told by your doctor. This is important. Contact a doctor if:  You have belly (abdominal) pain that: ? Gets worse. ? Stays in one area (localizes).  You have a rash.  You have a stiff neck.  You get angry or annoyed more easily than normal (irritability).  You are more sleepy than normal.  You have a harder time waking up than normal.  You feel: ? Weak. ? Dizzy. ? Very thirsty.  You have peed (urinated) only a small amount of very dark pee during 6-8 hours. Get help right away if:  You have symptoms of very bad dehydration.  You cannot drink fluids without throwing up (vomiting).  Your symptoms get worse with treatment.  You have a fever.  You have a very bad headache.  You are throwing up or having watery poop (diarrhea) and it: ? Gets worse. ? Does not go away.  You have blood or something green (bile) in your  throw-up.  You have blood in your poop (stool). This may cause poop to look black and tarry.  You have not peed in 6-8 hours.  You pass out (faint).  Your heart rate when you are sitting still is more than 100 beats a minute.  You have trouble breathing. This information is not intended to replace advice given to you by your health care provider. Make sure you discuss any questions you have with your health care provider. Document Released: 10/07/2009 Document Revised: 11/23/2017 Document Reviewed: 02/04/2016 Elsevier Patient Education  2020 [employer].

## 2019-11-19 NOTE — Progress Notes (Signed)
Bronwen Betters to be D/C'd Skilled nursing facility per MD order.  Discussed prescriptions and follow up appointments with the patient. Prescriptions given to patient, medication list explained in detail. Pt verbalized understanding.  Allergies as of 11/19/2019      Reactions   Bumetanide Other (See Comments)   unknown   Lasix [furosemide] Hives   Lipitor [atorvastatin] Hives   Other Other (See Comments)   Etacrynic acid unknown   Soap Other (See Comments)   AGENT:Certain types of soap   Statins Other (See Comments)   Hmg -Coa Reductase inhibitors   Torsemide Other (See Comments)   Unknown      Medication List    STOP taking these medications   insulin aspart 100 UNIT/ML injection Commonly known as: novoLOG   spironolactone 25 MG tablet Commonly known as: ALDACTONE     TAKE these medications   acetaminophen 500 MG tablet Commonly known as: TYLENOL Take 500 mg by mouth every 4 (four) hours as needed for fever or headache.   alum & mag hydroxide-simeth 200-200-20 MG/5ML suspension Commonly known as: MAALOX/MYLANTA Take 30 mLs by mouth every 6 (six) hours as needed for indigestion or heartburn.   amLODipine 10 MG tablet Commonly known as: NORVASC Take 10 mg by mouth every morning.   BAZA PROTECT EX Apply 1 application topically See admin instructions. Every shift   Minerin Creme Crea Apply 1 application topically 2 (two) times daily. 06AM and 1800 Apply to total body   cetirizine 10 MG tablet Commonly known as: ZYRTEC Take 10 mg by mouth daily as needed for allergies.   escitalopram 10 MG tablet Commonly known as: LEXAPRO Take 10 mg by mouth daily.   estradiol 0.1 MG/GM vaginal cream Commonly known as: ESTRACE Place 1 g vaginally once a week.   guaifenesin 100 MG/5ML syrup Commonly known as: ROBITUSSIN Take 200 mg by mouth every 6 (six) hours as needed for cough.   loperamide 2 MG capsule Commonly known as: IMODIUM Take 2 mg by mouth as needed for  diarrhea or loose stools. For diarrhea   LORazepam 0.5 MG tablet Commonly known as: ATIVAN Take 2 tablets (1 mg total) by mouth at bedtime.   magnesium hydroxide 400 MG/5ML suspension Commonly known as: MILK OF MAGNESIA Take 30 mLs by mouth at bedtime as needed for mild constipation.   metFORMIN 1000 MG tablet Commonly known as: GLUCOPHAGE Take 1,000 mg by mouth 2 (two) times daily with a meal.   nystatin powder Generic drug: nystatin Apply 1 Bottle topically daily as needed (Apply to Groin).   OLANZapine zydis 10 MG disintegrating tablet Commonly known as: ZYPREXA Take 10 mg by mouth every evening.   olopatadine 0.1 % ophthalmic solution Commonly known as: PATANOL Place 1 drop into both eyes daily as needed for allergies.   OPTIVE 0.5-0.9 % ophthalmic solution Generic drug: carboxymethylcellul-glycerin Place 1 drop into both eyes 2 (two) times daily.   pantoprazole 20 MG tablet Commonly known as: PROTONIX Take 20 mg by mouth daily.   traMADol 50 MG tablet Commonly known as: ULTRAM Take 1 tablet (50 mg total) by mouth 2 (two) times daily. Foot pain.   Triple Antibiotic [phone] Oint Apply 1 application topically as needed (Minor skin tears or abrasions).   Vitamin D3 50 MCG (2000 UT) Tabs Take 2,000 Units by mouth daily.       Vitals:   11/19/19 1333 11/19/19 1410  BP: (!) 153/61 (!) 159/87  Pulse: 73 86  Resp: (!) 24 18  Temp: 98.2 F (36.8 C) 97.7 F (36.5 C)  SpO2: 98% 99%    Skin clean, dry and intact without evidence of skin break down, no evidence of skin tears noted. IV catheter discontinued intact. Site without signs and symptoms of complications. Dressing and pressure applied. Pt denies pain at this time. No complaints noted.  An After Visit Summary was printed and given to the patient. Patient transferred to ALF via [facility].  Viviana Simpler S 11/19/2019 2:18 PM

## 2019-11-19 NOTE — Plan of Care (Signed)

## 2019-11-19 NOTE — NC FL2 (Signed)
[place] LEVEL OF CARE SCREENING TOOL     IDENTIFICATION  Patient Name: Katrina Burton Birthdate: Aug 15, 1936 Sex: female Admission Date (Current Location): 11/16/2019  [facility] and [place] Number:  Herbalist and Address:  [facility],  [place], [place]      Provider Number: [id number]  Attending Physician Name and Address:  [place] Donnamarie Poag, DO  Relative Name and Phone Number:       Current Level of Care: Hospital Recommended Level of Care: Memory Care, Page Prior Approval Number:    Date Approved/Denied:   PASRR Number:    Discharge Plan: Domiciliary (Rest home)([place])    Current Diagnoses: Patient Active Problem List   Diagnosis Date Noted  . CKD (chronic kidney disease) stage 3, GFR 30-59 ml/min 11/16/2019  . Diarrhea 08/27/2019  . Hypomagnesemia 08/27/2019  . Febrile illness 08/26/2019  . DKA (diabetic ketoacidoses) ([place]) 12/12/2013  . Hyponatremia 12/12/2013  . UTI (lower urinary tract infection) 04/29/2013  . DM (diabetes mellitus) ([place]) 04/29/2013  . Hypertension   . Dementia without behavioral disturbance (HCC)     Orientation RESPIRATION BLADDER Height & Weight     Self, Place  Normal Continent Weight: 85.9 kg Height:     BEHAVIORAL SYMPTOMS/MOOD NEUROLOGICAL BOWEL NUTRITION STATUS  (none) (none) Continent Diet(Carb modified)  AMBULATORY STATUS COMMUNICATION OF NEEDS Skin   Limited Assist Verbally Normal                       Personal Care Assistance Level of Assistance  Bathing, Feeding, Dressing Bathing Assistance: Limited assistance Feeding assistance: Limited assistance Dressing Assistance: Limited assistance     Functional Limitations Info  Sight, Hearing, Speech Sight Info: Adequate Hearing Info: Adequate Speech Info: Adequate    SPECIAL CARE FACTORS FREQUENCY  PT (By licensed PT)     PT Frequency: 2X/W               Contractures Contractures Info: Not present    Additional Factors Info  Code Status, Insulin Sliding Scale Code Status Info: full Allergies Info: Bumetanide, Lasix Furosemide, Lipitor Atorvastatin, Other, Soap, Statins, TorsemidePsychotropic Info: Lexapro 10mg  daily; Ativan 1mg  daily at bed; Zyprexa 10mg  daily at bedInsulin Sliding Scale Info: 0-15 units 3x/day with meals; 0-5 units daily at bed   Insulin Sliding Scale Info: 0-6 units 3X daily with meals, 0-5 units QHS       Current Medications (11/19/2019):  This is the current hospital active medication list Current Facility-Administered Medications  Medication Dose Route Frequency Provider Last Rate Last Dose  . acetaminophen (TYLENOL) tablet 650 mg  650 mg Oral Q6H PRN Shela Leff, MD   650 mg at 11/18/19 1446   Or  . acetaminophen (TYLENOL) suppository 650 mg  650 mg Rectal Q6H PRN Shela Leff, MD      . amLODipine (NORVASC) tablet 10 mg  10 mg Oral q morning - 10a Shela Leff, MD   10 mg at 11/19/19 0919  . cholecalciferol (VITAMIN D) tablet 2,000 Units  2,000 Units Oral Daily Shela Leff, MD   2,000 Units at 11/19/19 0919  . escitalopram (LEXAPRO) tablet 10 mg  10 mg Oral Daily Shela Leff, MD   10 mg at 11/19/19 0919  . estradiol (ESTRACE) vaginal cream 1 Applicatorful  1 Applicatorful Vaginal Weekly Shela Leff, MD   1 Applicatorful at 81/19/14 0122  . heparin injection 5,000 Units  5,000 Units Subcutaneous Q8H  John Giovanni, MD   5,000 Units at 11/19/19 0516  . hydrocerin (EUCERIN) cream 1 application  1 application Topical BID John Giovanni, MD   1 application at 11/19/19 [phone]  . insulin aspart (novoLOG) injection 0-5 Units  0-5 Units Subcutaneous QHS John Giovanni, MD   Stopped at 11/16/19 2200  . insulin aspart (novoLOG) injection 0-6 Units  0-6 Units Subcutaneous TID WC John Giovanni, MD      . loratadine (CLARITIN) tablet 10 mg  10 mg Oral Daily PRN John Giovanni, MD      . neomycin-bacitracin-polymyxin (NEOSPORIN) ointment packet 1 application  1 application Topical PRN John Giovanni, MD      . nystatin (MYCOSTATIN/NYSTOP) topical powder   Topical Daily PRN John Giovanni, MD      . OLANZapine zydis (ZYPREXA) disintegrating tablet 10 mg  10 mg Oral QPM John Giovanni, MD   10 mg at 11/18/19 1831  . olopatadine (PATANOL) 0.1 % ophthalmic solution 1 drop  1 drop Both Eyes Daily PRN John Giovanni, MD      . pantoprazole (PROTONIX) EC tablet 20 mg  20 mg Oral Daily John Giovanni, MD   20 mg at 11/19/19 0919  . polyvinyl alcohol (LIQUIFILM TEARS) 1.4 % ophthalmic solution 1 drop  1 drop Both Eyes BID John Giovanni, MD   1 drop at 11/19/19 0918  . traMADol (ULTRAM) tablet 50 mg  50 mg Oral BID John Giovanni, MD   50 mg at 11/19/19 0919     Discharge Medications:   STOP taking these medications   spironolactone 25 MG tablet Commonly known as: ALDACTONE     TAKE these medications   acetaminophen 500 MG tablet Commonly known as: TYLENOL Take 500 mg by mouth every 4 (four) hours as needed for fever or headache.   alum & mag hydroxide-simeth 200-200-20 MG/5ML suspension Commonly known as: MAALOX/MYLANTA Take 30 mLs by mouth every 6 (six) hours as needed for indigestion or heartburn.   amLODipine 10 MG tablet Commonly known as: NORVASC Take 10 mg by mouth every morning.   BAZA PROTECT EX Apply 1 application topically See admin instructions. Every shift   Minerin Creme Crea Apply 1 application topically 2 (two) times daily. 06AM and 1800 Apply to total body   cetirizine 10 MG tablet Commonly known as: ZYRTEC Take 10 mg by mouth daily as needed for allergies.   escitalopram 10 MG tablet Commonly known as: LEXAPRO Take 10 mg by mouth daily.   estradiol 0.1 MG/GM vaginal cream Commonly known as: ESTRACE Place 1 g vaginally once a week.   guaifenesin 100 MG/5ML syrup Commonly known as:  ROBITUSSIN Take 200 mg by mouth every 6 (six) hours as needed for cough.   insulin aspart 100 UNIT/ML injection Commonly known as: novoLOG Inject 6 Units into the skin as needed for high blood sugar. Sliding scale BS 0.350 = 0 unitrs If blood sugar is greater than 350 give 6 units FSBS every Monday, Wednesday and Friday   loperamide 2 MG capsule Commonly known as: IMODIUM Take 2 mg by mouth as needed for diarrhea or loose stools. For diarrhea   LORazepam 0.5 MG tablet Commonly known as: ATIVAN Take 2 tablets (1 mg total) by mouth at bedtime.   magnesium hydroxide 400 MG/5ML suspension Commonly known as: MILK OF MAGNESIA Take 30 mLs by mouth at bedtime as needed for mild constipation.   metFORMIN 1000 MG tablet Commonly known as: GLUCOPHAGE Take 1,000 mg by mouth 2 (two) times daily  with a meal.   nystatin powder Generic drug: nystatin Apply 1 Bottle topically daily as needed (Apply to Groin).   OLANZapine zydis 10 MG disintegrating tablet Commonly known as: ZYPREXA Take 10 mg by mouth every evening.   olopatadine 0.1 % ophthalmic solution Commonly known as: PATANOL Place 1 drop into both eyes daily as needed for allergies.   OPTIVE 0.5-0.9 % ophthalmic solution Generic drug: carboxymethylcellul-glycerin Place 1 drop into both eyes 2 (two) times daily.   pantoprazole 20 MG tablet Commonly known as: PROTONIX Take 20 mg by mouth daily.   traMADol 50 MG tablet Commonly known as: ULTRAM Take 1 tablet (50 mg total) by mouth 2 (two) times daily. Foot pain.   Triple Antibiotic [phone] Oint Apply 1 application topically as needed (Minor skin tears or abrasions).   Vitamin D3 50 MCG (2000 UT) Tabs Take 2,000 Units by mouth daily.     Relevant Imaging Results:  Relevant Lab Results:   Additional Information SS#: [id number]  Ida Rogueodney B Shamonique Battiste, LCSW

## 2019-11-19 NOTE — Discharge Summary (Addendum)
Physician Discharge Summary  Katrina Burton ZOX:[id number] DOB: [birth year] DOA: 11/16/2019  PCP: Ron Parker, MD  Admit date: 11/16/2019 Discharge date: 11/19/2019  Admitted From: [facility] Disposition: [facility]  Recommendations for Outpatient Follow-up:  1. Follow up with PCP in 1-2 weeks 2. Please obtain BMP in one week 3. Discontinue spironolactone secondary to acute renal failure secondary to dehydration 4. Please encourage increased fluid intake as leading etiology of her AKI secondary to dehydration  Home Health: No Equipment/Devices: None  Discharge Condition: Stable CODE STATUS: Full code Diet recommendation: Heart Healthy / Carb Modified   History of present illness:  Katrina Whitfieldis a 83 y.o.femalewith medical history significant ofadvanced dementia, type 2 diabetes, hypertension, hyperlipidemia presenting to the hospital via EMS from her nursing home for evaluation of low-grade fever (99.9 F) and altered mental status. Noted to be less responsive today. No cough or shortness of breath reported. Patient was seen in the ED 3 days ago for a fall and was noted to have left upper extremity swelling. She had x-rays done which were negative and a DVT study was ordered but never performed. No significant history could be obtained from the patient due to her advanced dementia.  ED Course:Afebrile. WBC count mildly elevated at 10.6. Lactic acid normal. UA not suggestive of infection. Urine culture pending. Blood culture x2 pending. Rapid SARS-CoV-2 test negative. Blood glucose 152. BUN 63, creatinine 3.9. Baseline creatinine 1.2-1.4. Potassium 5.3, bicarb 21. High-sensitivity troponin negative. Chest x-ray showing no active cardiopulmonary disease. Head CT negative for acute intracranial abnormalities. CT renal stone study showing no renal or ureteral stones or obstructive uropathy. Left upper extremity Doppler  done in the ED negative for DVT(preliminary report was given to Dr. Silverio Lay). Patient received Tylenol and 1 L normal saline bolus.  Hospital course:  AKI on CKD stage III BUN 63, creatinine 3.9. Baseline creatinine 1.2-1.4. Potassium 5.3, bicarb 21. AKI likely prerenal from dehydration and home spironolactone use. CT showing no renal or ureteral stones or obstructive uropathy.  Frontal lactone was discontinued.  Patient's creatinine improved from 3.96-1.06 at time of discharge with IV fluid hydration.  Continue to encourage increased oral intake following hospitalization.  Acute metabolic encephalopathy Hx Dementia Suspect due to uremia in the setting of acute renal failure.  Likely also complicated by history of dementia.No infectious etiology identified. Head CT negative for acute intracranial abnormality. Patient is currently awake and alert and resting comfortably.  Ammonia level 30, within normal limits.  TSH 1.645 and vitamin B12 within normal limits.  With resolution of AKI, patient has returned to her normal mental baseline per daughter.  Adult failure to thrive Back patient with poor oral intake.  Likely progressing dementia.  Discussed with 83daughter at bedside today, may consider hospice versus palliative care to continue to follow following discharge.    Mild leukocytosis Afebrile. WBC count mildly elevated at 10.6. Lactic acid normal. No signs of sepsis.UA not suggestive of infection.Chest x-ray not suggestive of pneumonia. SARS-CoV-2 test negative. Urine culture no growth  Type 2 diabetes mellitus Hemoglobin A1c 6.4, well controlled. Resume home metformin.  Depression/mood disorder Continue Lexapro, Zyprexa  Hypertension Continue amlodipine; discontinue spironolactone secondary to dehydration/AKI.  Chronic pain Continue tramadol, Tylenol as needed   Discharge Diagnoses:  Active Problems:   Hypertension   Dementia without behavioral disturbance  (HCC)   DM (diabetes mellitus) (HCC)   CKD (chronic kidney disease) stage 3, GFR 30-59 ml/min    Discharge Instructions  Discharge  Instructions    Call MD for:  difficulty breathing, headache or visual disturbances   Complete by: As directed    Call MD for:  extreme fatigue   Complete by: As directed    Call MD for:  persistant dizziness or light-headedness   Complete by: As directed    Call MD for:  persistant nausea and vomiting   Complete by: As directed    Call MD for:  severe uncontrolled pain   Complete by: As directed    Call MD for:  temperature >100.4   Complete by: As directed    Diet - low sodium heart healthy   Complete by: As directed    Increase activity slowly   Complete by: As directed      Allergies as of 11/19/2019      Reactions   Bumetanide Other (See Comments)   unknown   Lasix [furosemide] Hives   Lipitor [atorvastatin] Hives   Other Other (See Comments)   Etacrynic acid unknown   Soap Other (See Comments)   AGENT:Certain types of soap   Statins Other (See Comments)   Hmg -Coa Reductase inhibitors   Torsemide Other (See Comments)   Unknown      Medication List    STOP taking these medications   insulin aspart 100 UNIT/ML injection Commonly known as: novoLOG   spironolactone 25 MG tablet Commonly known as: ALDACTONE     TAKE these medications   acetaminophen 500 MG tablet Commonly known as: TYLENOL Take 500 mg by mouth every 4 (four) hours as needed for fever or headache.   alum & mag hydroxide-simeth 200-200-20 MG/5ML suspension Commonly known as: MAALOX/MYLANTA Take 30 mLs by mouth every 6 (six) hours as needed for indigestion or heartburn.   amLODipine 10 MG tablet Commonly known as: NORVASC Take 10 mg by mouth every morning.   BAZA PROTECT EX Apply 1 application topically See admin instructions. Every shift   Minerin Creme Crea Apply 1 application topically 2 (two) times daily. 06AM and 1800 Apply to total body    cetirizine 10 MG tablet Commonly known as: ZYRTEC Take 10 mg by mouth daily as needed for allergies.   escitalopram 10 MG tablet Commonly known as: LEXAPRO Take 10 mg by mouth daily.   estradiol 0.1 MG/GM vaginal cream Commonly known as: ESTRACE Place 1 g vaginally once a week.   guaifenesin 100 MG/5ML syrup Commonly known as: ROBITUSSIN Take 200 mg by mouth every 6 (six) hours as needed for cough.   loperamide 2 MG capsule Commonly known as: IMODIUM Take 2 mg by mouth as needed for diarrhea or loose stools. For diarrhea   LORazepam 0.5 MG tablet Commonly known as: ATIVAN Take 2 tablets (1 mg total) by mouth at bedtime.   magnesium hydroxide 400 MG/5ML suspension Commonly known as: MILK OF MAGNESIA Take 30 mLs by mouth at bedtime as needed for mild constipation.   metFORMIN 1000 MG tablet Commonly known as: GLUCOPHAGE Take 1,000 mg by mouth 2 (two) times daily with a meal.   nystatin powder Generic drug: nystatin Apply 1 Bottle topically daily as needed (Apply to Groin).   OLANZapine zydis 10 MG disintegrating tablet Commonly known as: ZYPREXA Take 10 mg by mouth every evening.   olopatadine 0.1 % ophthalmic solution Commonly known as: PATANOL Place 1 drop into both eyes daily as needed for allergies.   OPTIVE 0.5-0.9 % ophthalmic solution Generic drug: carboxymethylcellul-glycerin Place 1 drop into both eyes 2 (two) times daily.  pantoprazole 20 MG tablet Commonly known as: PROTONIX Take 20 mg by mouth daily.   traMADol 50 MG tablet Commonly known as: ULTRAM Take 1 tablet (50 mg total) by mouth 2 (two) times daily. Foot pain.   Triple Antibiotic [id number] Oint Apply 1 application topically as needed (Minor skin tears or abrasions).   Vitamin D3 50 MCG (2000 UT) Tabs Take 2,000 Units by mouth daily.      Follow-up Information    Ron Parker, MD. Schedule an appointment as soon as possible for a visit in 1 week(s).   Specialty: Internal  Medicine Contact information: [address] [place] [address] [phone]          Allergies  Allergen Reactions  . Bumetanide Other (See Comments)    unknown  . Lasix [Furosemide] Hives  . Lipitor [Atorvastatin] Hives  . Other Other (See Comments)    Etacrynic acid unknown  . Soap Other (See Comments)    AGENT:Certain types of soap  . Statins Other (See Comments)    Hmg -Coa Reductase inhibitors  . Torsemide Other (See Comments)    Unknown    Consultations:  Palliative care   Procedures/Studies: Dg Wrist Complete Left  Result Date: 11/13/2019 CLINICAL DATA:  Arm swelling, pain EXAM: LEFT WRIST - COMPLETE 3+ VIEW COMPARISON:  None. FINDINGS: Soft tissue swelling along the dorsum of the wrist and hand. No acute bony abnormality. Specifically, no fracture, subluxation, or dislocation. Joint spaces maintained. Early spurring in the 1st carpometacarpal joint and radiocarpal joint. IMPRESSION: No acute bony abnormality. Electronically Signed   By: Charlett Nose M.D.   On: 11/13/2019 20:39   Ct Head Wo Contrast  Result Date: 11/16/2019 CLINICAL DATA:  Pt arrives [facility] from [facility] with complaints of AMS since yesterday. Facility staff reports that the pt is more altered than normal. Pt has advanced dementia at baseline. Reports low grade fever of 99.90f. No cough or SHOB noted. EXAM: CT HEAD WITHOUT CONTRAST TECHNIQUE: Contiguous axial images were obtained from the base of the skull through the vertex without intravenous contrast. COMPARISON:  08/25/2019 FINDINGS: Brain: No evidence of acute infarction, hemorrhage, hydrocephalus, extra-axial collection or mass lesion/mass effect. Mild periventricular white matter hypoattenuation is noted consistent with chronic microvascular ischemic change. Vascular: No hyperdense vessel or unexpected calcification. Skull: Normal. Negative for fracture or focal lesion. Sinuses/Orbits: Globes and orbits are unremarkable. Visualized  sinuses and mastoid air cells are clear. Other: None. IMPRESSION: 1. No acute intracranial abnormalities. 2. Mild chronic microvascular ischemic change. Electronically Signed   By: Amie Portland M.D.   On: 11/16/2019 20:02   Dg Chest Port 1 View  Result Date: 11/16/2019 CLINICAL DATA:  Pt [facility] from [facility] with complaints of AMS since yesterday. Facility staff reports that the pt is more altered than normal. Pt has advanced dementia at baseline. Reports low grade fever of 99.40f. No cough or SHOB noted. HTN, DM, dementia EXAM: PORTABLE CHEST 1 VIEW COMPARISON:  08/25/2019 FINDINGS: Cardiac silhouette mildly enlarged. No mediastinal or hilar masses. Mild opacity at the right lung base which is likely atelectasis. Lungs otherwise clear. No convincing pleural effusion and no pneumothorax. Skeletal structures are grossly intact. IMPRESSION: No acute cardiopulmonary disease. Electronically Signed   By: Amie Portland M.D.   On: 11/16/2019 19:53   Dg Shoulder Left  Result Date: 11/13/2019 CLINICAL DATA:  Left arm pain, swelling. EXAM: LEFT SHOULDER - 2+ VIEW COMPARISON:  None. FINDINGS: Degenerative changes in the [facility] joint with joint space  narrowing and spurring. Glenohumeral joint is maintained. No acute bony abnormality. Specifically, no fracture, subluxation, or dislocation. Soft tissues are intact. IMPRESSION: Degenerative changes in the [facility] joint.  No acute bony abnormality. Electronically Signed   By: Rolm Baptise M.D.   On: 11/13/2019 20:39   Dg Humerus Left  Result Date: 11/13/2019 CLINICAL DATA:  Left arm pain, swelling EXAM: LEFT HUMERUS - 2+ VIEW COMPARISON:  None. FINDINGS: Early degenerative changes in the left [employer] joint with joint space narrowing and spurring. No acute bony abnormality. Specifically, no fracture, subluxation, or dislocation. IMPRESSION: No acute bony abnormality. Electronically Signed   By: Rolm Baptise M.D.   On: 11/13/2019 20:38   Dg Hand Complete  Left  Result Date: 11/13/2019 CLINICAL DATA:  Left arm pain, swelling EXAM: LEFT HAND - COMPLETE 3+ VIEW COMPARISON:  None. FINDINGS: Soft tissue swelling noted along the dorsum of the hand. No acute bony abnormality. Specifically, no fracture, subluxation, or dislocation. Joint spaces maintained. IMPRESSION: No acute bony abnormality. Electronically Signed   By: Rolm Baptise M.D.   On: 11/13/2019 20:38   Ct Renal Stone Study  Result Date: 11/16/2019 CLINICAL DATA:  Flank pain. Renal stone disease suspected. Altered mental status since yesterday. EXAM: CT ABDOMEN AND PELVIS WITHOUT CONTRAST TECHNIQUE: Multidetector CT imaging of the abdomen and pelvis was performed following the standard protocol without IV contrast. COMPARISON:  08/25/2019 FINDINGS: Lower chest: No acute abnormality. Hepatobiliary: No focal liver abnormality is seen. No gallstones, gallbladder wall thickening, or biliary dilatation. Pancreas: Unremarkable. No pancreatic ductal dilatation or surrounding inflammatory changes. Spleen: Normal in size without focal abnormality. Adrenals/Urinary Tract: No adrenal masses. Kidneys normal in overall size, orientation and position. Subcentimeter hyperattenuating cortical lesion from the upper pole the right kidney is likely a small hemorrhagic cyst but too small to characterize. No other renal masses, no stones and no hydronephrosis. Ureters normal in course and in caliber. Bladder is unremarkable. Stomach/Bowel: Stomach is unremarkable. Small bowel and colon are normal in caliber. No wall thickening or inflammation. No evidence of appendicitis. Vascular/Lymphatic: Minimal aortic atherosclerosis. No aneurysm. No enlarged lymph nodes. Reproductive: Status post hysterectomy. No adnexal masses. Other: No abdominal wall hernia or abnormality. No abdominopelvic ascites. Musculoskeletal: No fracture or acute finding. No osteoblastic or osteolytic lesions. Degenerative changes noted throughout the  visualized spine. IMPRESSION: 1. No acute findings. No renal or ureteral stones or obstructive uropathy. 2. Mild aortic atherosclerosis. Status post hysterectomy. Electronically Signed   By: Lajean Manes M.D.   On: 11/16/2019 20:07   Ue Venous Duplex (mc And W[facility] Only)  Result Date: 11/17/2019 UPPER VENOUS STUDY  Indications: Pain Comparison Study: No prior study on file for comparison Performing Technologist: Sharion Dove RVS  Examination Guidelines: A complete evaluation includes B-mode imaging, spectral Doppler, color Doppler, and power Doppler as needed of all accessible portions of each vessel. Bilateral testing is considered an integral part of a complete examination. Limited examinations for reoccurring indications may be performed as noted.  Right Findings: +----------+------------+---------+-----------+----------+-------+ RIGHT     CompressiblePhasicitySpontaneousPropertiesSummary +----------+------------+---------+-----------+----------+-------+ Subclavian               Yes       Yes                      +----------+------------+---------+-----------+----------+-------+  Left Findings: +----------+------------+---------+-----------+----------+---------------+ LEFT      CompressiblePhasicitySpontaneousProperties    Summary     +----------+------------+---------+-----------+----------+---------------+ IJV  Yes       Yes                              +----------+------------+---------+-----------+----------+---------------+ Subclavian               Yes       Yes                              +----------+------------+---------+-----------+----------+---------------+ Axillary      Full       Yes       Yes                              +----------+------------+---------+-----------+----------+---------------+ Brachial      Full       Yes       Yes                               +----------+------------+---------+-----------+----------+---------------+ Radial        Full                                                  +----------+------------+---------+-----------+----------+---------------+ Ulnar                                               patent by color +----------+------------+---------+-----------+----------+---------------+ Cephalic      Full                                                  +----------+------------+---------+-----------+----------+---------------+ Basilic       Full                                                  +----------+------------+---------+-----------+----------+---------------+  Summary:  Right: No evidence of thrombosis in the subclavian.  Left: No evidence of deep vein thrombosis in the upper extremity. No evidence of superficial vein thrombosis in the upper extremity.  *See table(s) above for measurements and observations.  Diagnosing physician: Gretta Began MD Electronically signed by Gretta Began MD on 11/17/2019 at 1:37:36 PM.    Final      Subjective: Patient seen and examined at bedside, resting comfortably.  No complaints this morning.  No family present at bedside.  Ready for discharge back to her memory care unit.  Denies chest pain, no shortness of breath, no abdominal pain.  No acute events overnight per nurse staff.   Discharge Exam: Vitals:   11/18/19 2238 11/19/19 0516  BP: (!) 173/91 (!) 149/78  Pulse: 76 80  Resp: 18 16  Temp: 98.7 F (37.1 C) 98.1 F (36.7 C)  SpO2: 100% 100%   Vitals:   11/18/19 0536 11/18/19 1334 11/18/19 2238 11/19/19 0516  BP: (!) 154/74 136/79 (!) 173/91 (!) 149/78  Pulse: 76 84  76 80  Resp: 20 20 18 16   Temp: 97.9 F (36.6 C) 98 F (36.7 C) 98.7 F (37.1 C) 98.1 F (36.7 C)  TempSrc:  Oral Oral Oral  SpO2: 100% 100% 100% 100%  Weight:        General: Pt is alert, awake, not in acute distress, pleasantly confused Cardiovascular: RRR, S1/S2 +, no rubs, no  gallops Respiratory: CTA bilaterally, no wheezing, no rhonchi Abdominal: Soft, NT, ND, bowel sounds + Extremities: no edema, no cyanosis    The results of significant diagnostics from this hospitalization (including imaging, microbiology, ancillary and laboratory) are listed below for reference.     Microbiology: Recent Results (from the past 240 hour(s))  Blood culture (routine x 2)     Status: None (Preliminary result)   Collection Time: 11/16/19  6:04 PM   Specimen: BLOOD  Result Value Ref Range Status   Specimen Description   Final    BLOOD LEFT WRIST Performed at [facility], [address], [place], [place]    Special Requests   Final    BOTTLES DRAWN AEROBIC AND ANAEROBIC Blood Culture adequate volume Performed at [facility], [address] [place], [place]    Culture   Final    NO GROWTH 3 DAYS Performed at [facility], [address] [place], [place]    Report Status PENDING  Incomplete  Blood culture (routine x 2)     Status: None (Preliminary result)   Collection Time: 11/16/19  6:09 PM   Specimen: BLOOD  Result Value Ref Range Status   Specimen Description   Final    BLOOD RIGHT WRIST Performed at [facility], [address], [place], [place]    Special Requests   Final    BOTTLES DRAWN AEROBIC ONLY Blood Culture results may not be optimal due to an inadequate volume of blood received in culture bottles Performed at [facility], [address] [place], [place]    Culture   Final    NO GROWTH 3 DAYS Performed at [facility], [address], [place], [place]    Report Status PENDING  Incomplete  Urine culture     Status: None   Collection Time: 11/16/19  6:57 PM   Specimen: Urine, Catheterized  Result Value Ref Range Status   Specimen Description   Final    URINE, CATHETERIZED Performed at [facility], [address], [place], [place]    Special Requests   Final    NONE Performed at [facility], [address] [place], [place]    Culture   Final    NO GROWTH Performed at [facility], [address] [place], [place]    Report Status 11/17/2019 FINAL  Final  SARS CORONAVIRUS 2 (TAT 6-24 HRS) Nasopharyngeal Nasopharyngeal Swab     Status: None   Collection Time: 11/16/19  8:13 PM   Specimen: Nasopharyngeal Swab  Result Value Ref Range Status   SARS Coronavirus 2 NEGATIVE NEGATIVE Final    Comment: (NOTE) SARS-CoV-2 target nucleic acids are NOT DETECTED. The SARS-CoV-2 RNA is generally detectable in upper and lower respiratory specimens during the acute phase of infection. Negative results do not preclude SARS-CoV-2 infection, do not rule out co-infections with other pathogens, and should not be used as the sole basis for treatment or other patient  management decisions. Negative results must be combined with clinical observations, patient history, and epidemiological information. The expected result is Negative. Fact Sheet for Patients: HairSlick.no Fact Sheet for Healthcare Providers: quierodirigir.com This test is not yet approved or cleared by the [place] FDA and  has been authorized for detection and/or diagnosis of SARS-CoV-2 by FDA under an Emergency Use Authorization (EUA). This EUA will remain  in effect (meaning this test can be used) for the duration of the COVID-19 declaration under Section 56 4(b)(1) of the Act, 21 U.S.C. section 360bbb-3(b)(1), unless the authorization is terminated or revoked sooner. Performed at [facility], [address], [place], [place] [address]   MRSA PCR Screening     Status: None   Collection Time: 11/17/19  7:50 AM   Specimen: Nasal Mucosa; Nasopharyngeal  Result Value Ref Range Status    MRSA by PCR NEGATIVE NEGATIVE Final    Comment:        The GeneXpert MRSA Assay (FDA approved for NASAL specimens only), is one component of a comprehensive MRSA colonization surveillance program. It is not intended to diagnose MRSA infection nor to guide or monitor treatment for MRSA infections. Performed at [facility], [address] [place], [place] [address]   SARS CORONAVIRUS 2 (TAT 6-24 HRS) Nasopharyngeal Nasopharyngeal Swab     Status: None   Collection Time: 11/18/19  2:19 PM   Specimen: Nasopharyngeal Swab  Result Value Ref Range Status   SARS Coronavirus 2 NEGATIVE NEGATIVE Final    Comment: (NOTE) SARS-CoV-2 target nucleic acids are NOT DETECTED. The SARS-CoV-2 RNA is generally detectable in upper and lower respiratory specimens during the acute phase of infection. Negative results do not preclude SARS-CoV-2 infection, do not rule out co-infections with other pathogens, and should not be used as the sole basis for treatment or other patient management decisions. Negative results must be combined with clinical observations, patient history, and epidemiological information. The expected result is Negative. Fact Sheet for Patients: HairSlick.no Fact Sheet for Healthcare Providers: quierodirigir.com This test is not yet approved or cleared by the [place] FDA and  has been authorized for detection and/or diagnosis of SARS-CoV-2 by FDA under an Emergency Use Authorization (EUA). This EUA will remain  in effect (meaning this test can be used) for the duration of the COVID-19 declaration under Section 56 4(b)(1) of the Act, 21 U.S.C. section 360bbb-3(b)(1), unless the authorization is terminated or revoked sooner. Performed at [facility], [address] [place], [place] [address]      Labs: BNP (last 3 results) Recent Labs    08/26/19 0133  BNP 163.9*   Basic Metabolic  Panel: Recent Labs  Lab 11/16/19 1847 11/17/19 0535 11/18/19 0621 11/19/19 0606  NA 136 137 138 139  K 5.3* 4.9 4.4 4.4  CL 106 109 112* 113*  CO2 21* 19* 19* 19*  GLUCOSE 152* 111* 112* 115*  BUN 63* 44* 26* 12  CREATININE 3.96* 2.32* 1.36* 1.06*  CALCIUM 10.1 9.2 9.5 9.6   Liver Function Tests: Recent Labs  Lab 11/16/19 1847  AST 33  ALT 19  ALKPHOS 73  BILITOT 0.5  PROT 8.3*  ALBUMIN 4.0   No results for input(s): LIPASE, AMYLASE in the last 168 hours. Recent Labs  Lab 11/16/19 2320  AMMONIA 10   CBC: Recent Labs  Lab 11/16/19 1847 11/17/19 0535 11/18/19 0621  WBC 10.6* 10.0 6.6  NEUTROABS 6.7  --   --   HGB  10.8* 11.1* 10.5*  HCT 35.1* 35.4* 34.4*  MCV 96.2 96.5 95.3  PLT 279 271 253   Cardiac Enzymes: No results for input(s): CKTOTAL, CKMB, CKMBINDEX, TROPONINI in the last 168 hours. BNP: Invalid input(s): POCBNP CBG: Recent Labs  Lab 11/18/19 1142 11/18/19 1614 11/18/19 2234 11/19/19 0746 11/19/19 1122  GLUCAP 115* 141* 139* 108* 169*   D-Dimer No results for input(s): DDIMER in the last 72 hours. Hgb A1c Recent Labs    11/16/19 2320  HGBA1C 6.4*   Lipid Profile No results for input(s): CHOL, HDL, LDLCALC, TRIG, CHOLHDL, LDLDIRECT in the last 72 hours. Thyroid function studies Recent Labs    11/16/19 2320  TSH 1.645   Anemia work up Recent Labs    11/16/19 2320  VITAMINB12 285   Urinalysis    Component Value Date/Time   COLORURINE YELLOW 11/16/2019 1858   APPEARANCEUR CLEAR 11/16/2019 1858   LABSPEC 1.018 11/16/2019 1858   PHURINE 5.0 11/16/2019 1858   GLUCOSEU NEGATIVE 11/16/2019 1858   HGBUR NEGATIVE 11/16/2019 1858   BILIRUBINUR NEGATIVE 11/16/2019 1858   KETONESUR NEGATIVE 11/16/2019 1858   PROTEINUR NEGATIVE 11/16/2019 1858   UROBILINOGEN 1.0 10/09/2014 2005   NITRITE NEGATIVE 11/16/2019 1858   LEUKOCYTESUR NEGATIVE 11/16/2019 1858   Sepsis Labs Invalid input(s): PROCALCITONIN,  WBC,   LACTICIDVEN Microbiology Recent Results (from the past 240 hour(s))  Blood culture (routine x 2)     Status: None (Preliminary result)   Collection Time: 11/16/19  6:04 PM   Specimen: BLOOD  Result Value Ref Range Status   Specimen Description   Final    BLOOD LEFT WRIST Performed at [facility], [address], [place], [place] [address]    Special Requests   Final    BOTTLES DRAWN AEROBIC AND ANAEROBIC Blood Culture adequate volume Performed at [facility], [address] [place], [place] [address]    Culture   Final    NO GROWTH 3 DAYS Performed at [facility], [address] [place], [place] [address]    Report Status PENDING  Incomplete  Blood culture (routine x 2)     Status: None (Preliminary result)   Collection Time: 11/16/19  6:09 PM   Specimen: BLOOD  Result Value Ref Range Status   Specimen Description   Final    BLOOD RIGHT WRIST Performed at [facility], [address] [place], [place] [address]    Special Requests   Final    BOTTLES DRAWN AEROBIC ONLY Blood Culture results may not be optimal due to an inadequate volume of blood received in culture bottles Performed at [facility], [address], [place], [place] [address]    Culture   Final    NO GROWTH 3 DAYS Performed at [facility], [address], [place], [place] [address]    Report Status PENDING  Incomplete  Urine culture     Status: None   Collection Time: 11/16/19  6:57 PM   Specimen: Urine, Catheterized  Result Value Ref Range Status   Specimen Description   Final    URINE, CATHETERIZED Performed at [facility], [address], [place], [place] [address]    Special Requests   Final    NONE Performed at [facility], [address] [place], [place] [address]    Culture   Final    NO GROWTH Performed at [facility], [address], [place],  [place] [address]    Report Status 11/17/2019 FINAL  Final  SARS CORONAVIRUS 2 (TAT 6-24 HRS) Nasopharyngeal Nasopharyngeal Swab     Status: None   Collection Time: 11/16/19  8:13 PM   Specimen: Nasopharyngeal Swab  Result Value Ref Range Status   SARS Coronavirus 2 NEGATIVE NEGATIVE Final    Comment: (NOTE) SARS-CoV-2 target nucleic acids are NOT DETECTED. The SARS-CoV-2 RNA is generally detectable in upper and lower respiratory specimens during the acute phase of infection. Negative results do not preclude SARS-CoV-2 infection, do not rule out co-infections with other pathogens, and should not be used as the sole basis for treatment or other patient management decisions. Negative results must be combined with clinical observations, patient history, and epidemiological information. The expected result is Negative. Fact Sheet for Patients: HairSlick.no Fact Sheet for Healthcare Providers: quierodirigir.com This test is not yet approved or cleared by the [place] FDA and  has been authorized for detection and/or diagnosis of SARS-CoV-2 by FDA under an Emergency Use Authorization (EUA). This EUA will remain  in effect (meaning this test can be used) for the duration of the COVID-19 declaration under Section 56 4(b)(1) of the Act, 21 U.S.C. section 360bbb-3(b)(1), unless the authorization is terminated or revoked sooner. Performed at [facility], [address], [place], [place] [address]   MRSA PCR Screening     Status: None   Collection Time: 11/17/19  7:50 AM   Specimen: Nasal Mucosa; Nasopharyngeal  Result Value Ref Range Status   MRSA by PCR NEGATIVE NEGATIVE Final    Comment:        The GeneXpert MRSA Assay (FDA approved for NASAL specimens only), is one component of a comprehensive MRSA colonization surveillance program. It is not intended to diagnose MRSA infection nor to guide or monitor treatment for MRSA  infections. Performed at [facility], [address] [place], [place] [address]   SARS CORONAVIRUS 2 (TAT 6-24 HRS) Nasopharyngeal Nasopharyngeal Swab     Status: None   Collection Time: 11/18/19  2:19 PM   Specimen: Nasopharyngeal Swab  Result Value Ref Range Status   SARS Coronavirus 2 NEGATIVE NEGATIVE Final    Comment: (NOTE) SARS-CoV-2 target nucleic acids are NOT DETECTED. The SARS-CoV-2 RNA is generally detectable in upper and lower respiratory specimens during the acute phase of infection. Negative results do not preclude SARS-CoV-2 infection, do not rule out co-infections with other pathogens, and should not be used as the sole basis for treatment or other patient management decisions. Negative results must be combined with clinical observations, patient history, and epidemiological information. The expected result is Negative. Fact Sheet for Patients: HairSlick.no Fact Sheet for Healthcare Providers: quierodirigir.com This test is not yet approved or cleared by the [place] FDA and  has been authorized for detection and/or diagnosis of SARS-CoV-2 by FDA under an Emergency Use Authorization (EUA). This EUA will remain  in effect (meaning this test can be used) for the duration of the COVID-19 declaration under Section 56 4(b)(1) of the Act, 21 U.S.C. section 360bbb-3(b)(1), unless the authorization is terminated or revoked sooner. Performed at [facility], [address], [place], [place] [address]      Time coordinating discharge: Over 30 minutes  SIGNED:   Alvira Philips [place], DO  [facility] 11/19/2019, 12:30 PM

## 2019-11-19 NOTE — NC FL2 (Addendum)
[place] LEVEL OF CARE SCREENING TOOL     IDENTIFICATION  Patient Name: Katrina Burton Birthdate: 12/29/1935 Sex: female Admission Date (Current Location): 11/16/2019  [facility] and [place] Number:  Herbalist and Address:  [facility],  [address], [place]      Provider Number: [id number]  Attending Physician Name and Address:  [place] Donnamarie Poag, DO  Relative Name and Phone Number:       Current Level of Care: Hospital Recommended Level of Care: Memory Care, [place] Prior Approval Number:    Date Approved/Denied:   PASRR Number:    Discharge Plan: Domiciliary (Rest home)([place])    Current Diagnoses: Patient Active Problem List   Diagnosis Date Noted  . CKD (chronic kidney disease) stage 3, GFR 30-59 ml/min 11/16/2019  . Diarrhea 08/27/2019  . Hypomagnesemia 08/27/2019  . Febrile illness 08/26/2019  . DKA (diabetic ketoacidoses) ([place]) 12/12/2013  . Hyponatremia 12/12/2013  . UTI (lower urinary tract infection) 04/29/2013  . DM (diabetes mellitus) ([place]) 04/29/2013  . Hypertension   . Dementia without behavioral disturbance (HCC)     Orientation RESPIRATION BLADDER Height & Weight     Self, Place  Normal Continent Weight: 85.9 kg Height:     BEHAVIORAL SYMPTOMS/MOOD NEUROLOGICAL BOWEL NUTRITION STATUS  (none) (none) Continent Diet(regular with chopped meat)  AMBULATORY STATUS COMMUNICATION OF NEEDS Skin   Limited Assist Verbally Normal                       Personal Care Assistance Level of Assistance  Bathing, Feeding, Dressing Bathing Assistance: Limited assistance Feeding assistance: Limited assistance Dressing Assistance: Limited assistance     Functional Limitations Info  Sight, Hearing, Speech Sight Info: Adequate Hearing Info: Adequate Speech Info: Adequate    SPECIAL CARE FACTORS FREQUENCY  PT (By licensed PT)     PT Frequency: 2X/W               Contractures Contractures Info: Not present    Additional Factors Info  Code Status, Code Status Info: full Allergies Info: Bumetanide, Lasix Furosemide, Lipitor Atorvastatin, Other, Soap, Statins, Torsemide          Current Medications (11/19/2019):  This is the current hospital active medication list Current Facility-Administered Medications  Medication Dose Route Frequency Provider Last Rate Last Dose  . acetaminophen (TYLENOL) tablet 650 mg  650 mg Oral Q6H PRN Shela Leff, MD   650 mg at 11/18/19 1446   Or  . acetaminophen (TYLENOL) suppository 650 mg  650 mg Rectal Q6H PRN Shela Leff, MD      . amLODipine (NORVASC) tablet 10 mg  10 mg Oral q morning - 10a Shela Leff, MD   10 mg at 11/19/19 0919  . cholecalciferol (VITAMIN D) tablet 2,000 Units  2,000 Units Oral Daily Shela Leff, MD   2,000 Units at 11/19/19 0919  . escitalopram (LEXAPRO) tablet 10 mg  10 mg Oral Daily Shela Leff, MD   10 mg at 11/19/19 0919  . estradiol (ESTRACE) vaginal cream 1 Applicatorful  1 Applicatorful Vaginal Weekly Shela Leff, MD   1 Applicatorful at 28/31/51 0122  . heparin injection 5,000 Units  5,000 Units Subcutaneous Q8H Shela Leff, MD   5,000 Units at 11/19/19 0516  . hydrocerin (EUCERIN) cream 1 application  1 application Topical BID Shela Leff, MD   1 application at 76/16/07 [phone]  . insulin aspart (novoLOG) injection 0-5 Units  0-5 Units Subcutaneous QHS John Giovanni, MD   Stopped at 11/16/19 2200  . insulin aspart (novoLOG) injection 0-6 Units  0-6 Units Subcutaneous TID WC John Giovanni, MD   1 Units at 11/19/19 1156  . loratadine (CLARITIN) tablet 10 mg  10 mg Oral Daily PRN John Giovanni, MD      . neomycin-bacitracin-polymyxin (NEOSPORIN) ointment packet 1 application  1 application Topical PRN John Giovanni, MD      . nystatin (MYCOSTATIN/NYSTOP) topical powder   Topical Daily PRN John Giovanni, MD      . OLANZapine zydis (ZYPREXA) disintegrating tablet 10 mg  10 mg Oral QPM John Giovanni, MD   10 mg at 11/18/19 1831  . olopatadine (PATANOL) 0.1 % ophthalmic solution 1 drop  1 drop Both Eyes Daily PRN John Giovanni, MD      . pantoprazole (PROTONIX) EC tablet 20 mg  20 mg Oral Daily John Giovanni, MD   20 mg at 11/19/19 0919  . polyvinyl alcohol (LIQUIFILM TEARS) 1.4 % ophthalmic solution 1 drop  1 drop Both Eyes BID John Giovanni, MD   1 drop at 11/19/19 0918  . traMADol (ULTRAM) tablet 50 mg  50 mg Oral BID John Giovanni, MD   50 mg at 11/19/19 0919     Discharge Medications: STOP taking these medications   insulin aspart 100 UNIT/ML injection Commonly known as: novoLOG   spironolactone 25 MG tablet Commonly known as: ALDACTONE     TAKE these medications   acetaminophen 500 MG tablet Commonly known as: TYLENOL Take 500 mg by mouth every 4 (four) hours as needed for fever or headache.   alum & mag hydroxide-simeth 200-200-20 MG/5ML suspension Commonly known as: MAALOX/MYLANTA Take 30 mLs by mouth every 6 (six) hours as needed for indigestion or heartburn.   amLODipine 10 MG tablet Commonly known as: NORVASC Take 10 mg by mouth every morning.   BAZA PROTECT EX Apply 1 application topically See admin instructions. Every shift   Minerin Creme Crea Apply 1 application topically 2 (two) times daily. 06AM and 1800 Apply to total body   cetirizine 10 MG tablet Commonly known as: ZYRTEC Take 10 mg by mouth daily as needed for allergies.   escitalopram 10 MG tablet Commonly known as: LEXAPRO Take 10 mg by mouth daily.   estradiol 0.1 MG/GM vaginal cream Commonly known as: ESTRACE Place 1 g vaginally once a week.   guaifenesin 100 MG/5ML syrup Commonly known as: ROBITUSSIN Take 200 mg by mouth every 6 (six) hours as needed for cough.   loperamide 2 MG capsule Commonly known as: IMODIUM Take 2 mg by mouth as needed  for diarrhea or loose stools. For diarrhea   LORazepam 0.5 MG tablet Commonly known as: ATIVAN Take 2 tablets (1 mg total) by mouth at bedtime.   magnesium hydroxide 400 MG/5ML suspension Commonly known as: MILK OF MAGNESIA Take 30 mLs by mouth at bedtime as needed for mild constipation.   metFORMIN 1000 MG tablet Commonly known as: GLUCOPHAGE Take 1,000 mg by mouth 2 (two) times daily with a meal.   nystatin powder Generic drug: nystatin Apply 1 Bottle topically daily as needed (Apply to Groin).   OLANZapine zydis 10 MG disintegrating tablet Commonly known as: ZYPREXA Take 10 mg by mouth every evening.   olopatadine 0.1 % ophthalmic solution Commonly known as: PATANOL Place 1 drop into both eyes daily as needed for allergies.   OPTIVE 0.5-0.9 % ophthalmic solution Generic drug: carboxymethylcellul-glycerin Place 1 drop into  both eyes 2 (two) times daily.   pantoprazole 20 MG tablet Commonly known as: PROTONIX Take 20 mg by mouth daily.   traMADol 50 MG tablet Commonly known as: ULTRAM Take 1 tablet (50 mg total) by mouth 2 (two) times daily. Foot pain.   Triple Antibiotic [phone] Oint Apply 1 application topically as needed (Minor skin tears or abrasions).   Vitamin D3 50 MCG (2000 UT) Tabs Take 2,000 Units by mouth daily.     Relevant Imaging Results:  Relevant Lab Results:   Additional Information SS#: [id number]  Ida Rogueodney B Wanona Stare, LCSW

## 2019-11-21 LAB — CULTURE, BLOOD (ROUTINE X 2)
Culture: NO GROWTH
Culture: NO GROWTH
Special Requests: ADEQUATE

## 2020-02-23 DEATH — deceased

## 2020-05-09 IMAGING — CT CT HEAD W/O CM
3 series · 15 of 47 positions shown, 18 images · non-contrast
Comparison: 08/25/2019

CLINICAL DATA: Pt arrives [REDACTED] from [facility] with complaints
of AMS since yesterday. Facility staff reports that the pt is more
altered than normal. Pt has advanced dementia at baseline. Reports
low grade fever of 33.3f. No cough or SHOB noted.

EXAM:
CT HEAD WITHOUT CONTRAST
TECHNIQUE: Contiguous axial images were obtained from the base of the skull
through the vertex without intravenous contrast.

[Series 2: head wo · axial · 0.44mm/px · z∈[-132,-2]mm · 9 of 32 slices shown, 12 images]
[im 3/32  brain]
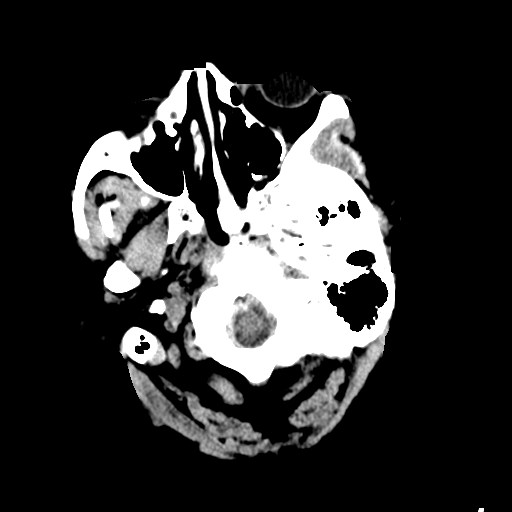
[im 3/32  bone]
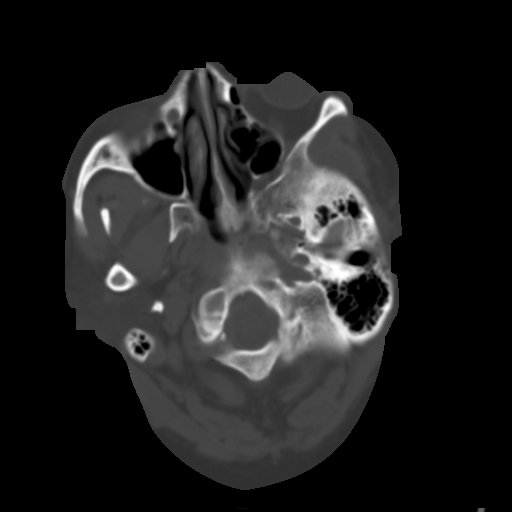
[im 6/32  brain]
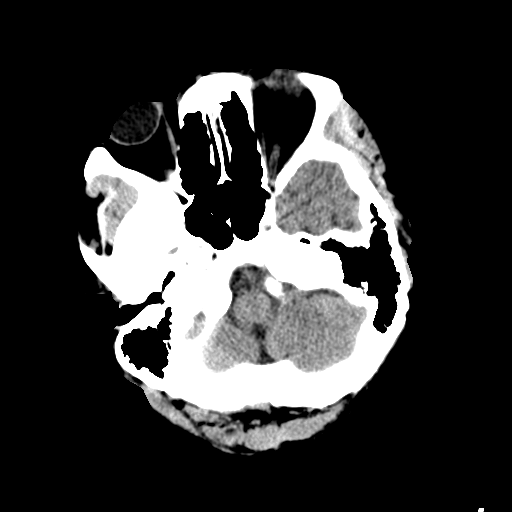
[im 9/32  brain]
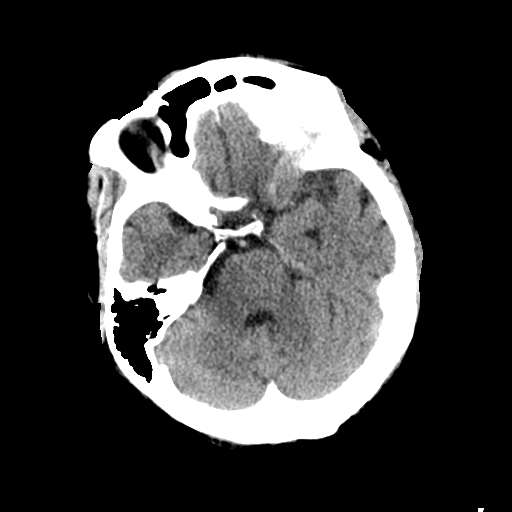
[im 12/32  brain]
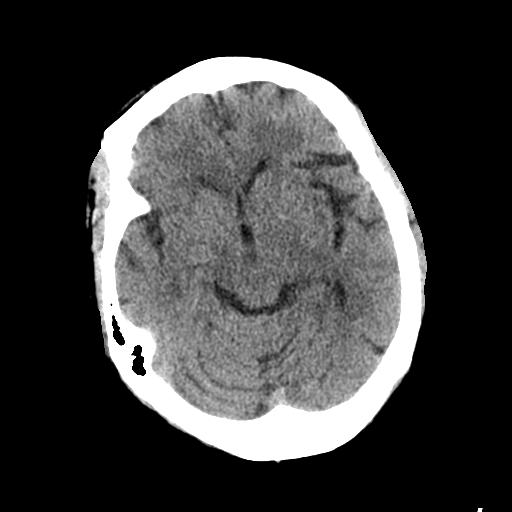
[im 17/32  brain]
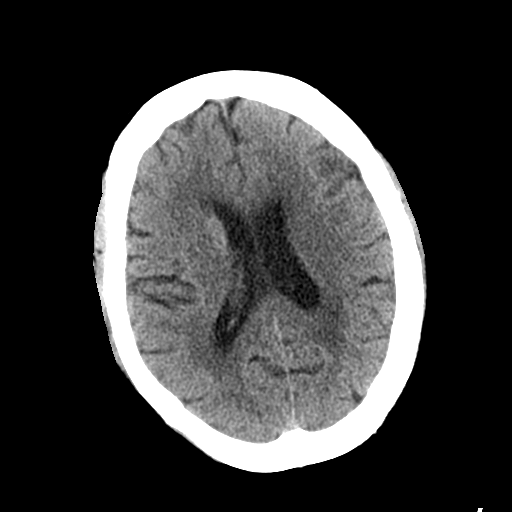
[im 17/32  bone]
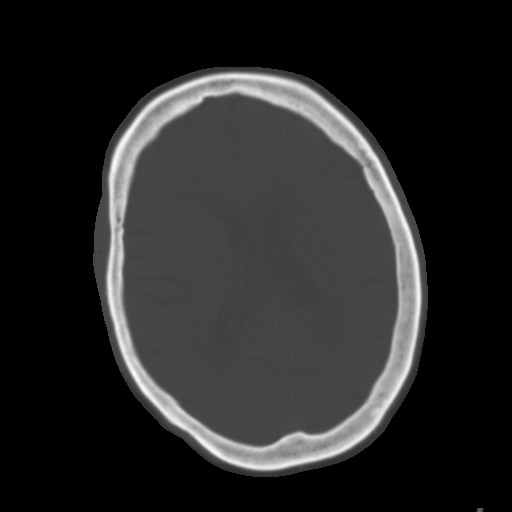
[im 20/32  brain]
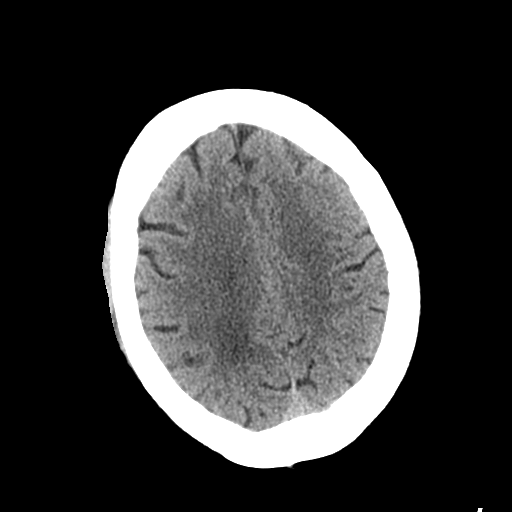
[im 23/32  brain]
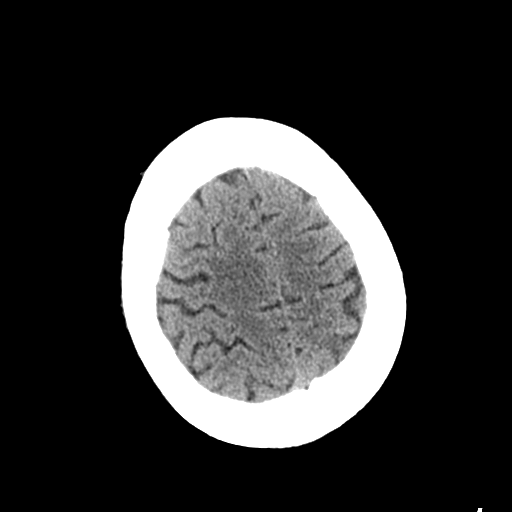
[im 26/32  brain]
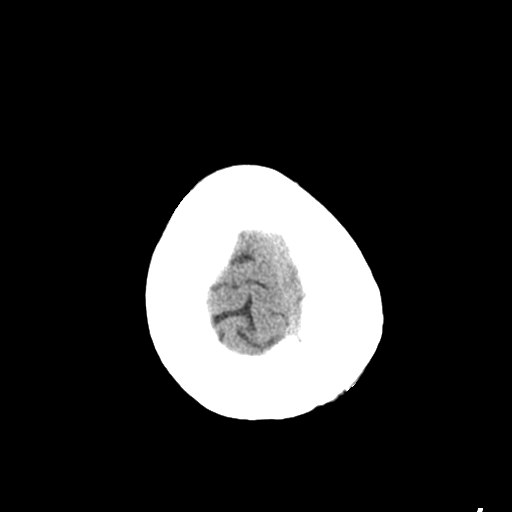
[im 29/32  brain]
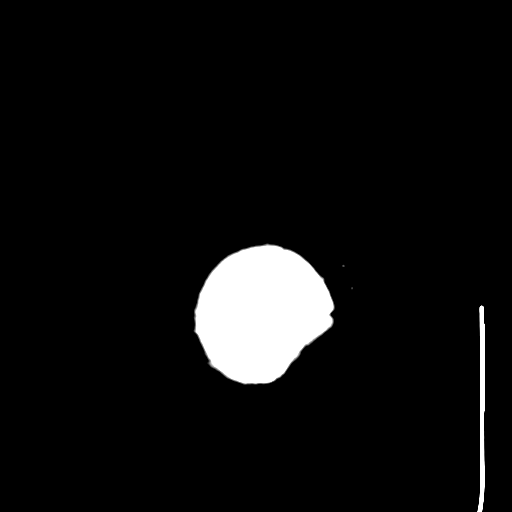
[im 29/32  bone]
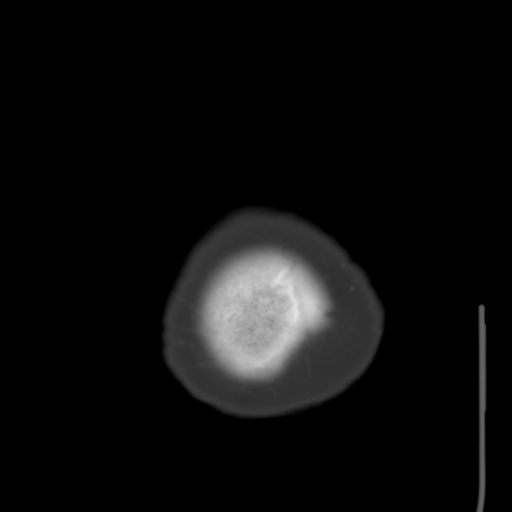

[Series 5: coronal soft tissue · coronal · 0.31mm/px · 3 of 72 slices shown]
[im 24/72  brain]
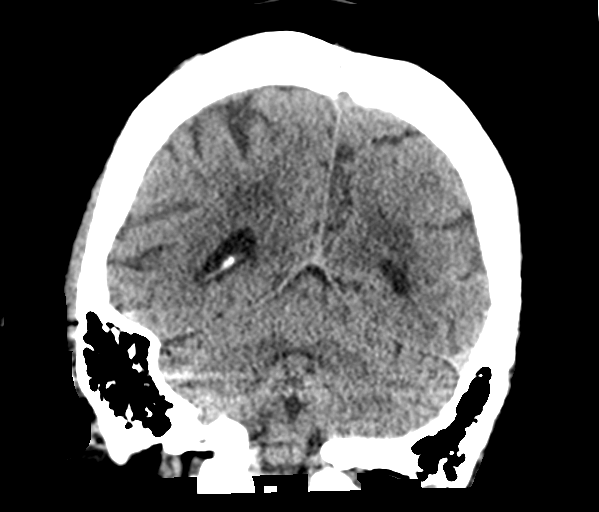
[im 32/72  brain]
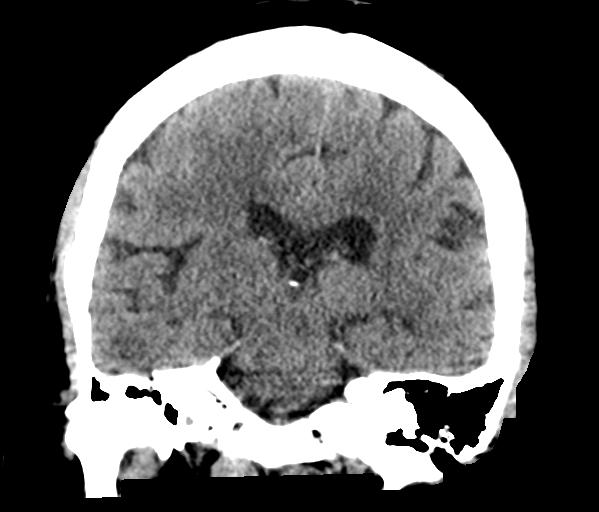
[im 40/72  brain]
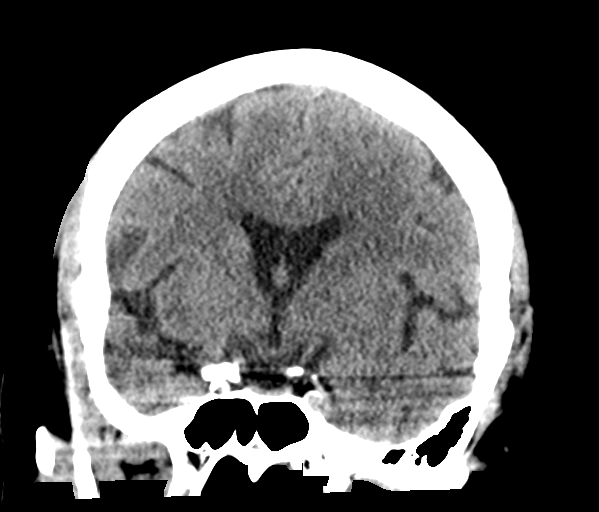

[Series 6: sagittal soft tissue · sagittal · 0.31mm/px · 3 of 59 slices shown]
[im 20/59  brain]
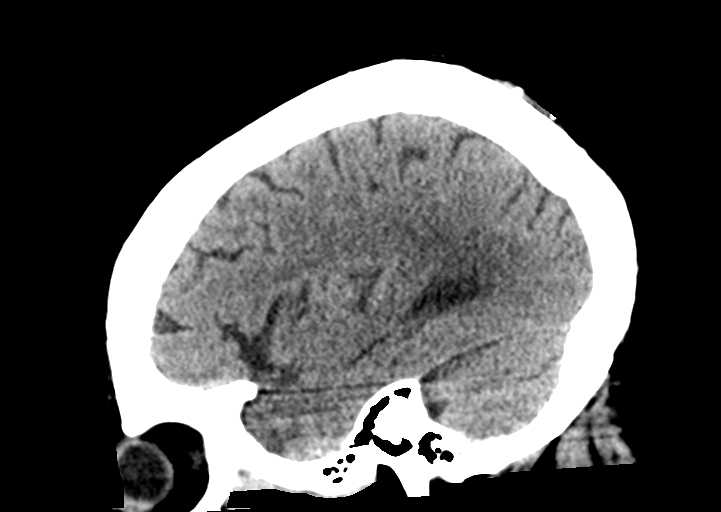
[im 30/59  brain]
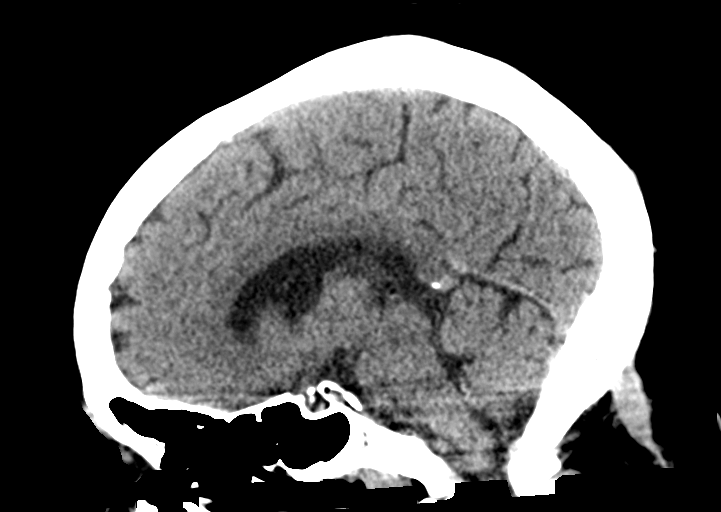
[im 39/59  brain]
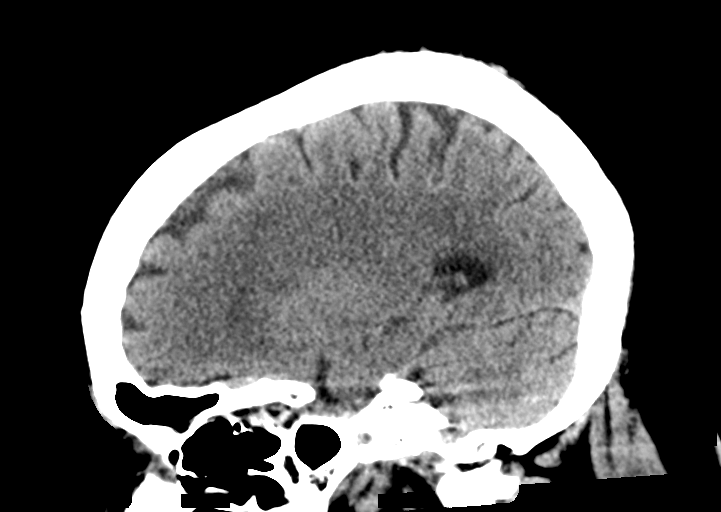

[15 of 47 positions shown; findings below may reference images not displayed]

FINDINGS: Brain: No evidence of acute infarction, hemorrhage, hydrocephalus,
extra-axial collection or mass lesion/mass effect.

Mild periventricular white matter hypoattenuation is noted
consistent with chronic microvascular ischemic change.

Vascular: No hyperdense vessel or unexpected calcification.

Skull: Normal. Negative for fracture or focal lesion.

Sinuses/Orbits: Globes and orbits are unremarkable. Visualized
sinuses and mastoid air cells are clear.

Other: None.
IMPRESSION: 1. No acute intracranial abnormalities.
2. Mild chronic microvascular ischemic change.
